# Patient Record
Sex: Female | Born: 2000 | Hispanic: No | Smoking: Never smoker
Health system: Southern US, Community
[De-identification: ages and names within clinical notes are randomized; demographics above are authoritative.]

## PROBLEM LIST (undated history)

## (undated) DIAGNOSIS — D151 Benign neoplasm of heart: Secondary | ICD-10-CM

## (undated) DIAGNOSIS — R0789 Other chest pain: Secondary | ICD-10-CM

## (undated) DIAGNOSIS — D447 Neoplasm of uncertain behavior of aortic body and other paraganglia: Secondary | ICD-10-CM

## (undated) DIAGNOSIS — F32A Depression, unspecified: Secondary | ICD-10-CM

## (undated) DIAGNOSIS — R569 Unspecified convulsions: Secondary | ICD-10-CM

## (undated) DIAGNOSIS — F329 Major depressive disorder, single episode, unspecified: Secondary | ICD-10-CM

## (undated) HISTORY — DX: Depression, unspecified: F32.A

## (undated) HISTORY — DX: Other chest pain: R07.89

## (undated) HISTORY — DX: Major depressive disorder, single episode, unspecified: F32.9

## (undated) HISTORY — PX: OTHER SURGICAL HISTORY: SHX169

---
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
## 2018-01-02 LAB — GC/CHLAMYDIA PROBE AMP (~~LOC~~) NOT AT ARMC
Chlamydia: NEGATIVE
NEISSERIA GONORRHEA: NEGATIVE

## 2018-02-18 ENCOUNTER — Other Ambulatory Visit: Payer: Self-pay

## 2018-02-18 ENCOUNTER — Encounter (HOSPITAL_COMMUNITY): Payer: Self-pay | Admitting: Emergency Medicine

## 2018-02-18 ENCOUNTER — Emergency Department (HOSPITAL_COMMUNITY): Payer: Medicaid Other

## 2018-02-18 DIAGNOSIS — Z79899 Other long term (current) drug therapy: Secondary | ICD-10-CM | POA: Insufficient documentation

## 2018-02-18 DIAGNOSIS — R0602 Shortness of breath: Secondary | ICD-10-CM | POA: Diagnosis present

## 2018-02-18 DIAGNOSIS — R55 Syncope and collapse: Secondary | ICD-10-CM | POA: Diagnosis not present
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
## 2018-02-19 ENCOUNTER — Other Ambulatory Visit: Payer: Self-pay

## 2018-02-19 ENCOUNTER — Emergency Department (HOSPITAL_COMMUNITY)
Admission: EM | Admit: 2018-02-19 | Discharge: 2018-02-19 | Disposition: A | Discharge status: Home / Self Care | Payer: Medicaid Other | Attending: Emergency Medicine | Admitting: Emergency Medicine

## 2018-02-19 DIAGNOSIS — R55 Syncope and collapse: Secondary | ICD-10-CM

## 2018-02-19 LAB — POCT I-STAT, CHEM 8
BUN: 18 mg/dL (ref 6–20)
CHLORIDE: 108 mmol/L (ref 98–111)
Calcium, Ion: 1.34 mmol/L (ref 1.15–1.40)
Creatinine, Ser: 0.8 mg/dL (ref 0.44–1.00)
GLUCOSE: 93 mg/dL (ref 70–99)
HEMATOCRIT: 35 % — AB (ref 36.0–46.0)
HEMOGLOBIN: 11.9 g/dL — AB (ref 12.0–15.0)
POTASSIUM: 4 mmol/L (ref 3.5–5.1)
Sodium: 141 mmol/L (ref 135–145)
TCO2: 23 mmol/L (ref 22–32)

## 2018-02-19 LAB — I-STAT BETA HCG BLOOD, ED (NOT ORDERABLE)

## 2018-02-19 MED ORDER — IBUPROFEN 400 MG PO TABS
400.0000 mg | ORAL_TABLET | Freq: Once | ORAL | Status: AC
  Administered 2018-02-19: 400 mg via ORAL
  Filled 2018-02-19: qty 1
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
## 2018-02-19 NOTE — ED Notes (Signed)
Checked pulse ox while ambulating pt. Pre-ambulation pulse ox was 100%. During ambulation pulse ox was 95%. Post-ambulation post ox was 98%.

## 2018-02-27 ENCOUNTER — Other Ambulatory Visit (HOSPITAL_BASED_OUTPATIENT_CLINIC_OR_DEPARTMENT_OTHER): Payer: Self-pay

## 2018-02-27 DIAGNOSIS — R55 Syncope and collapse: Secondary | ICD-10-CM

## 2018-03-03 ENCOUNTER — Ambulatory Visit: Payer: Medicaid Other | Attending: Pediatrics | Admitting: Neurology

## 2018-03-03 DIAGNOSIS — G4710 Hypersomnia, unspecified: Secondary | ICD-10-CM | POA: Diagnosis present

## 2018-03-03 DIAGNOSIS — R55 Syncope and collapse: Secondary | ICD-10-CM

## 2018-03-04 NOTE — Procedures (Signed)
  Aurora A. Merlene Laughter, MD     [email or web address]             NOCTURNAL POLYSOMNOGRAPHY   LOCATION: [facility]   Patient Name: Elizabeth Wilson, Elizabeth Wilson Date: 03/03/2018 Gender: Female D.O.B: [birth year] Age (years): 16 Referring Provider: Marcell Anger Height (inches): 62 Interpreting Physician: Phillips Odor MD, ABSM Weight (lbs): 92 RPSGT: Peak, Robert BMI: 17 MRN: [id number] Neck Size: 14.00 CLINICAL INFORMATION Sleep Study Type: NPSG     Indication for sleep study: N/A     Epworth Sleepiness Score: 17     SLEEP STUDY TECHNIQUE As per the AASM Manual for the Scoring of Sleep and Associated Events v2.3 (April 2016) with a hypopnea requiring 4% desaturations.  The channels recorded and monitored were frontal, central and occipital EEG, electrooculogram (EOG), submentalis EMG (chin), nasal and oral airflow, thoracic and abdominal wall motion, anterior tibialis EMG, snore microphone, electrocardiogram, and pulse oximetry.  MEDICATIONS Medications self-administered by patient taken the night of the study : N/A  Current Outpatient Medications:  .  escitalopram (LEXAPRO) 10 MG tablet, Take 10 mg by mouth daily., Disp: , Rfl:      SLEEP ARCHITECTURE The study was initiated at 10:26:28 PM and ended at 5:01:58 AM.  Sleep onset time was 18.0 minutes and the sleep efficiency was 88.6%%. The total sleep time was 350.5 minutes.  Stage REM latency was 229.0 minutes.  The patient spent 3.0%% of the night in stage N1 sleep, 71.9%% in stage N2 sleep, 13.1%% in stage N3 and 12% in REM.  Alpha intrusion was absent.  Supine sleep was 1.73%.  RESPIRATORY PARAMETERS The overall apnea/hypopnea index (AHI) was 0.7 per hour. There were 3 total apneas, including 0 obstructive, 3 central and 0 mixed apneas. There were 1 hypopneas and 14 RERAs.  The AHI during Stage REM sleep was 1.4 per hour.  AHI while supine was 0.0 per hour.  The mean oxygen saturation  was 97.9%. The minimum SpO2 during sleep was 95.0%.  soft snoring was noted during this study.  CARDIAC DATA The 2 lead EKG demonstrated sinus rhythm. The mean heart rate was 65.5 beats per minute. Other EKG findings include: None. LEG MOVEMENT DATA The total PLMS were 0 with a resulting PLMS index of 0.0. Associated arousal with leg movement index was 0.0.  IMPRESSIONS No significant sleep disordered breathing occurred during this study. Given that she has hypersomnia, a formal sleep consultation is suggested for additional work up.    Delano Metz, MD Diplomate, American Board of Sleep Medicine. ELECTRONICALLY SIGNED ON:  03/04/2018, 7:13 PM [place] PH: [phone]   FX: [phone] [place]

## 2018-04-04 ENCOUNTER — Encounter: Payer: Self-pay | Admitting: Cardiology
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
## 2018-04-05 ENCOUNTER — Encounter: Payer: Self-pay | Admitting: *Deleted

## 2018-04-05 ENCOUNTER — Encounter: Payer: Self-pay | Admitting: Cardiology

## 2018-04-05 ENCOUNTER — Encounter (INDEPENDENT_AMBULATORY_CARE_PROVIDER_SITE_OTHER): Payer: Medicaid Other

## 2018-04-05 ENCOUNTER — Ambulatory Visit (INDEPENDENT_AMBULATORY_CARE_PROVIDER_SITE_OTHER): Payer: Medicaid Other | Admitting: Cardiology

## 2018-04-05 ENCOUNTER — Telehealth: Payer: Self-pay | Admitting: Cardiology

## 2018-04-05 VITALS — BP 108/70 | HR 79 | Ht 62.0 in | Wt 96.0 lb

## 2018-04-05 DIAGNOSIS — Z87898 Personal history of other specified conditions: Secondary | ICD-10-CM

## 2018-04-05 DIAGNOSIS — R55 Syncope and collapse: Secondary | ICD-10-CM

## 2018-04-05 NOTE — Telephone Encounter (Signed)
°  Precert needed for: 2 D Echo (to be done at [facility]) dx: syncope   Location: [facility]     Date: Apr 10, 2018

## 2018-04-05 NOTE — Patient Instructions (Addendum)
Medication Instructions:   Your physician recommends that you continue on your current medications as directed. Please refer to the Current Medication list given to you today.  Labwork:  NONE  Testing/Procedures: Your physician has requested that you have an echocardiogram. Echocardiography is a painless test that uses sound waves to create images of your heart. It provides your doctor with information about the size and shape of your heart and how well your heart's chambers and valves are working. This procedure takes approximately one hour. There are no restrictions for this procedure. Your physician has recommended that you wear a holter monitor. Holter monitors are medical devices that record the heart's electrical activity. Doctors most often use these monitors to diagnose arrhythmias. Arrhythmias are problems with the speed or rhythm of the heartbeat. The monitor is a small, portable device. You can wear one while you do your normal daily activities. This is usually used to diagnose what is causing palpitations/syncope (passing out).  Follow-Up:  Your physician recommends that you schedule a follow-up appointment in: pending test results.  Any Other Special Instructions Will Be Listed Below (If Applicable).  If you need a refill on your cardiac medications before your next appointment, please call your pharmacy.

## 2018-04-10 ENCOUNTER — Ambulatory Visit (HOSPITAL_COMMUNITY)
Admission: RE | Admit: 2018-04-10 | Discharge: 2018-04-10 | Disposition: A | Discharge status: Home / Self Care | Payer: Medicaid Other | Source: Ambulatory Visit | Attending: Cardiology | Admitting: Cardiology

## 2018-04-10 DIAGNOSIS — R55 Syncope and collapse: Secondary | ICD-10-CM | POA: Insufficient documentation

## 2018-04-10 DIAGNOSIS — F329 Major depressive disorder, single episode, unspecified: Secondary | ICD-10-CM | POA: Diagnosis not present

## 2018-04-10 DIAGNOSIS — Z87898 Personal history of other specified conditions: Secondary | ICD-10-CM

## 2018-04-10 DIAGNOSIS — R0789 Other chest pain: Secondary | ICD-10-CM | POA: Diagnosis not present

## 2018-04-10 NOTE — Progress Notes (Signed)
*  PRELIMINARY RESULTS* Echocardiogram 2D Echocardiogram has been performed.  Elizabeth Wilson 04/10/2018, 2:53 PM

## 2018-04-14 ENCOUNTER — Telehealth: Payer: Self-pay | Admitting: *Deleted

## 2018-04-14 DIAGNOSIS — D151 Benign neoplasm of heart: Secondary | ICD-10-CM

## 2018-04-14 DIAGNOSIS — Z01810 Encounter for preprocedural cardiovascular examination: Secondary | ICD-10-CM

## 2018-04-14 NOTE — Telephone Encounter (Signed)
-----   Message from Satira Sark, MD sent at 04/12/2018  2:30 PM EDT ----- Results reviewed.  I personally reviewed the images as well and I also had them reviewed by Dr. Meda Coffee.  In terms of her history of syncope, her LVEF is normal at 55 to 60% and there are no major valvular abnormalities.  The left atrial abnormality may well be an incidental finding as it relates to current clinical symptoms, but does need further clarification.  Would be concerned that this could be an atrial myxoma.  Rather than pursuing a TEE, please talk with her about scheduling a diagnostic cardiac MRI with Dr. Meda Coffee in [place] to read. A copy of this test should be forwarded to Pennie Rushing, MD.

## 2018-04-18 NOTE — Telephone Encounter (Signed)
-----   Message from Satira Sark, MD sent at 04/12/2018  2:30 PM EDT ----- Results reviewed.  I personally reviewed the images as well and I also had them reviewed by Dr. Meda Coffee.  In terms of her history of syncope, her LVEF is normal at 55 to 60% and there are no major valvular abnormalities.  The left atrial abnormality may well be an incidental finding as it relates to current clinical symptoms, but does need further clarification.  Would be concerned that this could be an atrial myxoma.  Rather than pursuing a TEE, please talk with her about scheduling a diagnostic cardiac MRI with Dr. Meda Coffee in [place] to read. A copy of this test should be forwarded to Pennie Rushing, MD.

## 2018-04-21 ENCOUNTER — Encounter: Payer: Self-pay | Admitting: *Deleted

## 2018-04-21 NOTE — Telephone Encounter (Signed)
Letter mailed to patient requesting she contact our office.

## 2018-04-22 ENCOUNTER — Other Ambulatory Visit: Payer: Self-pay

## 2018-04-22 ENCOUNTER — Emergency Department (HOSPITAL_COMMUNITY)
Admission: EM | Admit: 2018-04-22 | Discharge: 2018-04-22 | Disposition: A | Discharge status: Home / Self Care | Payer: Medicaid Other | Attending: Emergency Medicine | Admitting: Emergency Medicine

## 2018-04-22 ENCOUNTER — Encounter (HOSPITAL_COMMUNITY): Payer: Self-pay | Admitting: Emergency Medicine

## 2018-04-22 DIAGNOSIS — R112 Nausea with vomiting, unspecified: Secondary | ICD-10-CM | POA: Insufficient documentation

## 2018-04-22 DIAGNOSIS — R197 Diarrhea, unspecified: Secondary | ICD-10-CM | POA: Insufficient documentation

## 2018-04-22 DIAGNOSIS — Z79899 Other long term (current) drug therapy: Secondary | ICD-10-CM | POA: Insufficient documentation

## 2018-04-22 MED ORDER — ONDANSETRON HCL 4 MG PO TABS: 4.0000 mg | ORAL_TABLET | Freq: Four times a day (QID) | ORAL | 0 refills | Status: DC

## 2018-04-22 MED ORDER — ONDANSETRON 4 MG PO TBDP
4.0000 mg | ORAL_TABLET | Freq: Once | ORAL | Status: AC
  Administered 2018-04-22: 4 mg via ORAL
  Filled 2018-04-22: qty 1

## 2018-04-22 NOTE — ED Triage Notes (Signed)
Pt c/o n/v/d/chills and fever x 2 days

## 2018-04-22 NOTE — ED Notes (Signed)
Pt ambulatory to waiting room. Pt verbalized understanding of discharge instructions.   

## 2018-04-22 NOTE — Discharge Instructions (Addendum)
Clear liquids.  Prescription for nausea medication.  Return if worse.

## 2018-04-22 NOTE — ED Provider Notes (Signed)
[facility] EMERGENCY DEPARTMENT Provider Note   CSN: [id number] Arrival date & time: 04/22/18  1929     History   Chief Complaint Chief Complaint  Patient presents with  . Emesis    HPI Elizabeth Wilson is a 17 y.o. female.  Nausea, vomiting, diarrhea, chills for 36 hours.  Patient is ambulatory.  She is urinating.  Normally healthy.  Severity of symptoms is mild.  Nothing makes symptoms better or worse     Past Medical History:  Diagnosis Date  . Atypical chest pain   . Depression     Patient Active Problem List   Diagnosis Date Noted  . Depression 03/08/2017  . Yeast infection 03/08/2017  . Vaginal itching 03/08/2017    Past Surgical History:  Procedure Laterality Date  . No surgical history       OB History    Gravida  0   Para  0   Term  0   Preterm  0   AB  0   Living  0     SAB  0   TAB  0   Ectopic  0   Multiple  0   Live Births  0            Home Medications    Prior to Admission medications   Medication Sig Start Date End Date Taking? Authorizing Provider  escitalopram (LEXAPRO) 10 MG tablet Take 10 mg by mouth daily.    [provider]  ondansetron (ZOFRAN) 4 MG tablet Take 1 tablet (4 mg total) by mouth every 6 (six) hours. 04/22/18   Nat Christen, MD    Family History Family History  Problem Relation Age of Onset  . Cancer Paternal Grandfather   . Lung cancer Paternal Grandmother   . Congestive Heart Failure Maternal Grandmother   . Heart attack Maternal Grandmother   . Other Maternal Grandmother        Tumor; shrinking blood vessels to brain  . Hypertension Mother   . Anxiety disorder Mother   . Asthma Brother     Social History Social History   Tobacco Use  . Smoking status: Never Smoker  . Smokeless tobacco: Never Used  Substance Use Topics  . Alcohol use: No  . Drug use: No     Allergies   Patient has no known allergies.   Review of Systems Review of Systems  All other systems  reviewed and are negative.    Physical Exam Updated Vital Signs BP 113/67 (BP Location: Right Arm)   Pulse 80   Temp 98.9 F (37.2 C) (Oral)   Resp 16   Wt 45.4 kg   LMP 11/26/2017   SpO2 100%   BMI 18.29 kg/m   Physical Exam  Constitutional: She is oriented to person, place, and time. She appears well-developed and well-nourished.  Well-hydrated, nontoxic-appearing  HENT:  Head: Normocephalic and atraumatic.  Eyes: Conjunctivae are normal.  Neck: Neck supple.  Cardiovascular: Normal rate and regular rhythm.  Pulmonary/Chest: Effort normal and breath sounds normal.  Abdominal: Soft. Bowel sounds are normal.  Musculoskeletal: Normal range of motion.  Neurological: She is alert and oriented to person, place, and time.  Skin: Skin is warm and dry.  Psychiatric: She has a normal mood and affect. Her behavior is normal.  Nursing note and vitals reviewed.    ED Treatments / Results  Labs (all labs ordered are listed, but only abnormal results are displayed) Labs Reviewed - No data to display  EKG None  Radiology No results found.  Procedures Procedures (including critical care time)  Medications Ordered in ED Medications  ondansetron (ZOFRAN-ODT) disintegrating tablet 4 mg (4 mg Oral Given 04/22/18 2016)     Initial Impression / Assessment and Plan / ED Course  I have reviewed the triage vital signs and the nursing notes.  Pertinent labs & imaging results that were available during my care of the patient were reviewed by me and considered in my medical decision making (see chart for details).     Nausea, vomiting, diarrhea for 36 hours.  Patient is nontoxic-appearing.  No clinical evidence of dehydration.  She is ambulatory.  Will Rx Zofran 4 mg as an outpatient.  Final Clinical Impressions(s) / ED Diagnoses   Final diagnoses:  Nausea vomiting and diarrhea    ED Discharge Orders         Ordered    ondansetron (ZOFRAN) 4 MG tablet  Every 6 hours      04/22/18 2024           Nat Christen, MD 04/22/18 2114

## 2018-05-03 ENCOUNTER — Other Ambulatory Visit: Payer: Self-pay | Admitting: *Deleted

## 2018-05-03 ENCOUNTER — Encounter: Payer: Self-pay | Admitting: *Deleted

## 2018-05-03 DIAGNOSIS — D151 Benign neoplasm of heart: Secondary | ICD-10-CM

## 2018-05-03 DIAGNOSIS — Z01810 Encounter for preprocedural cardiovascular examination: Secondary | ICD-10-CM

## 2018-05-03 NOTE — Telephone Encounter (Signed)
Patient's mother informed and verbalized understanding of plan. Lab order faxed to [facility] lab. Copy sent to PCP

## 2018-05-06 ENCOUNTER — Encounter (HOSPITAL_COMMUNITY): Payer: Self-pay | Admitting: Emergency Medicine

## 2018-05-06 ENCOUNTER — Other Ambulatory Visit: Payer: Self-pay

## 2018-05-06 ENCOUNTER — Emergency Department (HOSPITAL_COMMUNITY)
Admission: EM | Admit: 2018-05-06 | Discharge: 2018-05-06 | Disposition: A | Discharge status: Home / Self Care | Payer: Self-pay | Attending: Emergency Medicine | Admitting: Emergency Medicine

## 2018-05-06 ENCOUNTER — Emergency Department (HOSPITAL_COMMUNITY): Payer: Self-pay

## 2018-05-06 DIAGNOSIS — R0789 Other chest pain: Secondary | ICD-10-CM | POA: Insufficient documentation

## 2018-05-06 DIAGNOSIS — Z79899 Other long term (current) drug therapy: Secondary | ICD-10-CM | POA: Insufficient documentation

## 2018-05-06 HISTORY — DX: Benign neoplasm of heart: D15.1

## 2018-05-06 LAB — CBC WITH DIFFERENTIAL/PLATELET
Abs Immature Granulocytes: 0.02 10*3/uL (ref 0.00–0.07)
Basophils Absolute: 0 10*3/uL (ref 0.0–0.1)
Basophils Relative: 0 %
EOS ABS: 0.1 10*3/uL (ref 0.0–0.5)
EOS PCT: 1 %
HEMATOCRIT: 39.4 % (ref 36.0–46.0)
Hemoglobin: 12.6 g/dL (ref 12.0–15.0)
IMMATURE GRANULOCYTES: 0 %
LYMPHS ABS: 2 10*3/uL (ref 0.7–4.0)
Lymphocytes Relative: 29 %
MCH: 30.4 pg (ref 26.0–34.0)
MCHC: 32 g/dL (ref 30.0–36.0)
MCV: 95.2 fL (ref 80.0–100.0)
MONO ABS: 0.8 10*3/uL (ref 0.1–1.0)
MONOS PCT: 11 %
Neutro Abs: 4 10*3/uL (ref 1.7–7.7)
Neutrophils Relative %: 59 %
Platelets: 346 10*3/uL (ref 150–400)
RBC: 4.14 MIL/uL (ref 3.87–5.11)
RDW: 12.8 % (ref 11.5–15.5)
WBC: 6.8 10*3/uL (ref 4.0–10.5)
nRBC: 0 % (ref 0.0–0.2)

## 2018-05-06 LAB — COMPREHENSIVE METABOLIC PANEL
ALT: 13 U/L (ref 0–44)
AST: 19 U/L (ref 15–41)
Albumin: 4.6 g/dL (ref 3.5–5.0)
Alkaline Phosphatase: 47 U/L (ref 38–126)
Anion gap: 9 (ref 5–15)
BILIRUBIN TOTAL: 0.8 mg/dL (ref 0.3–1.2)
BUN: 12 mg/dL (ref 6–20)
CO2: 22 mmol/L (ref 22–32)
Calcium: 9.7 mg/dL (ref 8.9–10.3)
Chloride: 107 mmol/L (ref 98–111)
Creatinine, Ser: 0.75 mg/dL (ref 0.44–1.00)
GFR calc Af Amer: >60 mL/min (ref >60)
Glucose, Bld: 70 mg/dL (ref 70–99)
POTASSIUM: 3.4 mmol/L — AB (ref 3.5–5.1)
Sodium: 138 mmol/L (ref 135–145)
TOTAL PROTEIN: 7.8 g/dL (ref 6.5–8.1)

## 2018-05-06 LAB — D-DIMER, QUANTITATIVE (NOT AT ARMC): D DIMER QUANT: 0.31 ug{FEU}/mL (ref 0.00–0.50)

## 2018-05-06 MED ORDER — IBUPROFEN 800 MG PO TABS
800.0000 mg | ORAL_TABLET | Freq: Once | ORAL | Status: AC
  Administered 2018-05-06: 800 mg via ORAL
  Filled 2018-05-06: qty 1

## 2018-05-06 MED ORDER — HYDROCODONE-ACETAMINOPHEN 5-325 MG PO TABS
1.0000 | ORAL_TABLET | Freq: Once | ORAL | Status: AC
  Administered 2018-05-06: 1 via ORAL
  Filled 2018-05-06: qty 1

## 2018-05-06 MED ORDER — TRAMADOL HCL 50 MG PO TABS: ORAL_TABLET | ORAL | 0 refills | Status: DC

## 2018-05-06 NOTE — Discharge Instructions (Addendum)
Call your cardiologist office and find out when your MRI is scheduled

## 2018-05-06 NOTE — ED Notes (Signed)
Patient transported to x-ray. ?

## 2018-05-06 NOTE — ED Provider Notes (Signed)
[facility] EMERGENCY DEPARTMENT Provider Note   CSN: [id number] Arrival date & time: 05/06/18  1501     History   Chief Complaint Chief Complaint  Patient presents with  . Chest Pain    HPI Elizabeth Wilson is a 17 y.o. female.  Patient complains of right-sided chest pain.  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  Patient states the pain seems to be worse with inspiration  The history is provided by the patient. No language interpreter was used.  Chest Pain   This is a new problem. The current episode started 12 to 24 hours ago. The problem occurs constantly. The problem has not changed since onset.The pain is associated with movement and breathing. The pain is present in the lateral region. The pain is at a severity of 4/10. The pain is moderate. The quality of the pain is described as pleuritic. The pain does not radiate. Pertinent negatives include no abdominal pain, no back pain, no cough and no headaches.  Pertinent negatives for past medical history include no seizures.    Past Medical History:  Diagnosis Date  . Atrial myxoma   . Atypical chest pain   . Depression     Patient Active Problem List   Diagnosis Date Noted  . Depression 03/08/2017  . Yeast infection 03/08/2017  . Vaginal itching 03/08/2017    Past Surgical History:  Procedure Laterality Date  . No surgical history       OB History    Gravida  0   Para  0   Term  0   Preterm  0   AB  0   Living  0     SAB  0   TAB  0   Ectopic  0   Multiple  0   Live Births  0            Home Medications    Prior to Admission medications   Medication Sig Start Date End Date Taking? Authorizing Provider  medroxyPROGESTERone (DEPO-PROVERA) 150 MG/ML injection Inject 150 mg into the muscle every 3 (three) months.   Yes [provider]  ondansetron (ZOFRAN) 4 MG tablet Take 1 tablet (4 mg total) by mouth every 6 (six) hours. Patient not taking:  Reported on 05/06/2018 04/22/18   Nat Christen, MD  traMADol Veatrice Bourbon) 50 MG tablet Take 1 every 6-8 hours for pain not relieved by Tylenol or Motrin 05/06/18   Milton Ferguson, MD    Family History Family History  Problem Relation Age of Onset  . Cancer Paternal Grandfather   . Lung cancer Paternal Grandmother   . Congestive Heart Failure Maternal Grandmother   . Heart attack Maternal Grandmother   . Other Maternal Grandmother        Tumor; shrinking blood vessels to brain  . Hypertension Mother   . Anxiety disorder Mother   . Asthma Brother     Social History Social History   Tobacco Use  . Smoking status: Never Smoker  . Smokeless tobacco: Never Used  Substance Use Topics  . Alcohol use: No  . Drug use: No     Allergies   Patient has no known allergies.   Review of Systems Review of Systems  Constitutional: Negative for appetite change and fatigue.  HENT: Negative for congestion, ear discharge and sinus pressure.   Eyes: Negative for discharge.  Respiratory: Negative for cough.   Cardiovascular: Positive for chest pain.  Gastrointestinal:  Negative for abdominal pain and diarrhea.  Genitourinary: Negative for frequency and hematuria.  Musculoskeletal: Negative for back pain.  Skin: Negative for rash.  Neurological: Negative for seizures and headaches.  Psychiatric/Behavioral: Negative for hallucinations.     Physical Exam Updated Vital Signs BP 104/71   Pulse 85   Resp 19   Wt 45.4 kg   SpO2 97%   BMI 18.29 kg/m   Physical Exam  Constitutional: She is oriented to person, place, and time. She appears well-developed.  HENT:  Head: Normocephalic.  Eyes: Conjunctivae and EOM are normal. No scleral icterus.  Neck: Neck supple. No thyromegaly present.  Cardiovascular: Normal rate and regular rhythm. Exam reveals no gallop and no friction rub.  No murmur heard. Pulmonary/Chest: No stridor. She has no wheezes. She has no rales. She exhibits tenderness.    Tenderness to right chest  Abdominal: She exhibits no distension. There is no tenderness. There is no rebound.  Musculoskeletal: Normal range of motion. She exhibits no edema.  Lymphadenopathy:    She has no cervical adenopathy.  Neurological: She is oriented to person, place, and time. She exhibits normal muscle tone. Coordination normal.  Skin: No rash noted. No erythema.  Psychiatric: She has a normal mood and affect. Her behavior is normal.     ED Treatments / Results  Labs (all labs ordered are listed, but only abnormal results are displayed) Labs Reviewed  COMPREHENSIVE METABOLIC PANEL - Abnormal; Notable for the following components:      Result Value   Potassium 3.4 (*)    All other components within normal limits  CBC WITH DIFFERENTIAL/PLATELET  D-DIMER, QUANTITATIVE (NOT AT [facility])    EKG EKG Interpretation  Date/Time:  Saturday May 06 2018 15:11:08 EST Ventricular Rate:  100 PR Interval:    QRS Duration: 90 QT Interval:  339 QTC Calculation: 438 R Axis:   59 Text Interpretation:  Sinus tachycardia Right atrial enlargement Baseline wander in lead(s) V6 Confirmed by Milton Ferguson [phone]) on 05/06/2018 4:36:32 PM   Radiology Dg Chest 2 View  Result Date: 05/06/2018 CLINICAL DATA:  Chest pain for 2 days with shortness of breath and intermittent dizziness. Atrial myxoma. EXAM: CHEST - 2 VIEW COMPARISON:  Radiographs 02/18/2018 and 11/12/2017. FINDINGS: The heart size and mediastinal contours are normal. The lungs are clear. There is no pleural effusion or pneumothorax. No acute osseous findings are identified. Telemetry leads overlie the chest. IMPRESSION: Stable chest.  No active cardiopulmonary process. Electronically Signed   By: Richardean Sale M.D.   On: 05/06/2018 16:29    Procedures Procedures (including critical care time)  Medications Ordered in ED Medications  ibuprofen (ADVIL,MOTRIN) tablet 800 mg (800 mg Oral Given 05/06/18 1527)   HYDROcodone-acetaminophen (NORCO/VICODIN) 5-325 MG per tablet 1 tablet (1 tablet Oral Given 05/06/18 1649)     Initial Impression / Assessment and Plan / ED Course  I have reviewed the triage vital signs and the nursing notes.  Pertinent labs & imaging results that were available during my care of the patient were reviewed by me and considered in my medical decision making (see chart for details).     Labs EKG chest x-ray unremarkable.  Suspect pain is chest wall pain.  She is given Ultram for pain that is not relieved by Tylenol or Motrin and instructed to get in touch with her cardiologist to find out when the MRI for her atrial myxoma is.  Doubt this is causing her chest discomfort Final Clinical Impressions(s) / ED Diagnoses  Final diagnoses:  Atypical chest pain    ED Discharge Orders         Ordered    traMADol (ULTRAM) 50 MG tablet     05/06/18 1711           Milton Ferguson, MD 05/06/18 1715

## 2018-05-06 NOTE — ED Triage Notes (Signed)
Pt reports chest pain x 2 days with [facility] and intermittent dizziness. Pt dx with atrial myxoma earlier this month. Hasn't started any medications yet but is supposed to be getting a prescription.

## 2018-05-09 ENCOUNTER — Encounter: Payer: Self-pay | Admitting: *Deleted

## 2018-05-09 ENCOUNTER — Telehealth: Payer: Self-pay | Admitting: *Deleted

## 2018-05-09 NOTE — Telephone Encounter (Signed)
Left message regarding Cardiac MRI scheduled 05/26/18 at 8:00am--Will also mail information to patient

## 2018-05-09 NOTE — Telephone Encounter (Signed)
-----   Message from Baldwin Crown sent at 05/09/2018  8:48 AM EST ----- Regarding: RE: Cardiact MRI  12/13 at 8am ----- Message ----- From: Roland Earl Sent: 05/04/2018   8:14 AM EST To: Baldwin Crown, [facility] Pre Cert/Auth Subject: Cardiact MRI                                   Ordered by Dr. Domenic Polite to be ead by Dr. Meda Coffee

## 2018-05-26 ENCOUNTER — Telehealth: Payer: Self-pay | Admitting: *Deleted

## 2018-05-26 ENCOUNTER — Ambulatory Visit (HOSPITAL_COMMUNITY)
Admission: RE | Admit: 2018-05-26 | Discharge: 2018-05-26 | Disposition: A | Discharge status: Home / Self Care | Payer: Self-pay | Source: Ambulatory Visit | Attending: Cardiology | Admitting: Cardiology

## 2018-05-26 DIAGNOSIS — R9431 Abnormal electrocardiogram [ECG] [EKG]: Secondary | ICD-10-CM

## 2018-05-26 DIAGNOSIS — D151 Benign neoplasm of heart: Secondary | ICD-10-CM | POA: Insufficient documentation

## 2018-05-26 MED ORDER — GADOBUTROL 1 MMOL/ML IV SOLN
5.0000 mL | Freq: Once | INTRAVENOUS | Status: AC | PRN
  Administered 2018-05-26: 5 mL via INTRAVENOUS

## 2018-05-26 NOTE — Telephone Encounter (Signed)
Mother informed and copy sent to PCP

## 2018-05-26 NOTE — Telephone Encounter (Signed)
-----   Message from Satira Sark, MD sent at 05/26/2018  8:41 AM EST ----- Results reviewed.  Cardiac monitor did not demonstrate any significant arrhythmias, no unusual degree of bradycardia, and sinus tachycardia rates would not be out of the norm for a 17 year old. A copy of this test should be forwarded to Pennie Rushing, MD.

## 2018-05-29 ENCOUNTER — Telehealth: Payer: Self-pay | Admitting: *Deleted

## 2018-05-29 NOTE — Telephone Encounter (Signed)
Patient informed and copy sent to PCP. 

## 2018-05-29 NOTE — Telephone Encounter (Signed)
-----   Message from Satira Sark, MD sent at 05/27/2018  7:40 AM EST ----- Results reviewed.  This is very good news.  Cardiac MRI is normal.  There is no evidence of any significant intracardiac mass. A copy of this test should be forwarded to Pennie Rushing, MD.

## 2018-06-03 ENCOUNTER — Encounter (HOSPITAL_COMMUNITY): Payer: Self-pay | Admitting: Emergency Medicine

## 2018-06-03 ENCOUNTER — Other Ambulatory Visit: Payer: Self-pay

## 2018-06-03 ENCOUNTER — Emergency Department (HOSPITAL_COMMUNITY)
Admission: EM | Admit: 2018-06-03 | Discharge: 2018-06-03 | Disposition: A | Discharge status: Home / Self Care | Payer: Self-pay | Attending: Emergency Medicine | Admitting: Emergency Medicine

## 2018-06-03 DIAGNOSIS — Z79899 Other long term (current) drug therapy: Secondary | ICD-10-CM | POA: Insufficient documentation

## 2018-06-03 DIAGNOSIS — R112 Nausea with vomiting, unspecified: Secondary | ICD-10-CM | POA: Insufficient documentation

## 2018-06-03 HISTORY — DX: Unspecified convulsions: R56.9

## 2018-06-03 LAB — HCG, QUANTITATIVE, PREGNANCY

## 2018-06-03 MED ORDER — ONDANSETRON 4 MG PO TBDP: 4.0000 mg | ORAL_TABLET | Freq: Three times a day (TID) | ORAL | 0 refills | Status: DC | PRN

## 2018-06-03 MED ORDER — ONDANSETRON 4 MG PO TBDP
4.0000 mg | ORAL_TABLET | Freq: Once | ORAL | Status: AC
  Administered 2018-06-03: 4 mg via ORAL
  Filled 2018-06-03: qty 1

## 2018-06-03 NOTE — ED Notes (Signed)
Dr Lacinda Axon in to reassess

## 2018-06-03 NOTE — ED Notes (Signed)
Pt reports N/V since last night  She is in NAD  Reports she is unable to void at this time for Urine preg  Last period 6/19

## 2018-06-03 NOTE — Discharge Instructions (Addendum)
Clear liquids.  Medication for nausea.  Rest.

## 2018-06-03 NOTE — ED Provider Notes (Signed)
[facility] EMERGENCY DEPARTMENT Provider Note   CSN: [id number] Arrival date & time: 06/03/18  1221     History   Chief Complaint Chief Complaint  Patient presents with  . Abdominal Pain    HPI Elizabeth Wilson is a 17 y.o. female.  Several episodes of vomiting since last night.  No diarrhea, fever, sweats, chills.  She is drinking fluids.  Good urinary output.  Severity is mild.  Nothing makes symptoms better or worse.     Past Medical History:  Diagnosis Date  . Atrial myxoma   . Atypical chest pain   . Depression   . Seizures [facility])     Patient Active Problem List   Diagnosis Date Noted  . Depression 03/08/2017  . Yeast infection 03/08/2017  . Vaginal itching 03/08/2017    Past Surgical History:  Procedure Laterality Date  . No surgical history       OB History    Gravida  0   Para  0   Term  0   Preterm  0   AB  0   Living  0     SAB  0   TAB  0   Ectopic  0   Multiple  0   Live Births  0            Home Medications    Prior to Admission medications   Medication Sig Start Date End Date Taking? Authorizing Provider  medroxyPROGESTERone (DEPO-PROVERA) 150 MG/ML injection Inject 150 mg into the muscle every 3 (three) months.    [provider]  ondansetron (ZOFRAN ODT) 4 MG disintegrating tablet Take 1 tablet (4 mg total) by mouth every 8 (eight) hours as needed for nausea or vomiting. 06/03/18   Nat Christen, MD  ondansetron (ZOFRAN) 4 MG tablet Take 1 tablet (4 mg total) by mouth every 6 (six) hours. Patient not taking: Reported on 05/06/2018 04/22/18   Nat Christen, MD  traMADol Veatrice Bourbon) 50 MG tablet Take 1 every 6-8 hours for pain not relieved by Tylenol or Motrin 05/06/18   Milton Ferguson, MD    Family History Family History  Problem Relation Age of Onset  . Cancer Paternal Grandfather   . Lung cancer Paternal Grandmother   . Congestive Heart Failure Maternal Grandmother   . Heart attack Maternal Grandmother   .  Other Maternal Grandmother        Tumor; shrinking blood vessels to brain  . Hypertension Mother   . Anxiety disorder Mother   . Asthma Brother     Social History Social History   Tobacco Use  . Smoking status: Never Smoker  . Smokeless tobacco: Never Used  Substance Use Topics  . Alcohol use: No  . Drug use: No     Allergies   Patient has no known allergies.   Review of Systems Review of Systems  All other systems reviewed and are negative.    Physical Exam Updated Vital Signs BP 130/75 (BP Location: Right Arm)   Pulse 68   Temp 98.6 F (37 C) (Oral)   Resp 16   Ht 5\' 3"  (1.6 m)   Wt 40.8 kg   LMP 11/24/2017   SpO2 100%   BMI 15.94 kg/m   Physical Exam Vitals signs and nursing note reviewed.  Constitutional:      Appearance: She is well-developed.  HENT:     Head: Normocephalic and atraumatic.  Eyes:     Conjunctiva/sclera: Conjunctivae normal.  Neck:  Musculoskeletal: Neck supple.  Cardiovascular:     Rate and Rhythm: Normal rate and regular rhythm.  Pulmonary:     Effort: Pulmonary effort is normal.     Breath sounds: Normal breath sounds.  Abdominal:     General: Bowel sounds are normal.     Palpations: Abdomen is soft.  Musculoskeletal: Normal range of motion.  Skin:    General: Skin is warm and dry.  Neurological:     Mental Status: She is alert and oriented to person, place, and time.  Psychiatric:        Behavior: Behavior normal.      ED Treatments / Results  Labs (all labs ordered are listed, but only abnormal results are displayed) Labs Reviewed  HCG, QUANTITATIVE, PREGNANCY    EKG None  Radiology No results found.  Procedures Procedures (including critical care time)  Medications Ordered in ED Medications  ondansetron (ZOFRAN-ODT) disintegrating tablet 4 mg (4 mg Oral Given 06/03/18 1347)     Initial Impression / Assessment and Plan / ED Course  I have reviewed the triage vital signs and the nursing  notes.  Pertinent labs & imaging results that were available during my care of the patient were reviewed by me and considered in my medical decision making (see chart for details).     Patient is nontoxic-appearing.  Suspect low level gastritis.  Rx Zofran 4 mg ODT.  Final Clinical Impressions(s) / ED Diagnoses   Final diagnoses:  Intractable vomiting with nausea, unspecified vomiting type    ED Discharge Orders         Ordered    ondansetron (ZOFRAN ODT) 4 MG disintegrating tablet  Every 8 hours PRN     06/03/18 1349           Nat Christen, MD 06/03/18 1416

## 2018-06-03 NOTE — ED Triage Notes (Signed)
Pt reports she has had 3 episodes of emesis with nausea since last night, denies d/fever

## 2018-06-03 NOTE — ED Notes (Signed)
Dr C in to assess 

## 2018-06-03 NOTE — ED Notes (Signed)
Pt has had no vomiting since arrival to treatment area

## 2018-06-10 ENCOUNTER — Emergency Department (HOSPITAL_COMMUNITY)
Admission: EM | Admit: 2018-06-10 | Discharge: 2018-06-10 | Disposition: A | Discharge status: Home / Self Care | Payer: Self-pay | Attending: Emergency Medicine | Admitting: Emergency Medicine

## 2018-06-10 ENCOUNTER — Emergency Department (HOSPITAL_COMMUNITY): Payer: Self-pay

## 2018-06-10 ENCOUNTER — Other Ambulatory Visit: Payer: Self-pay

## 2018-06-10 ENCOUNTER — Encounter (HOSPITAL_COMMUNITY): Payer: Self-pay | Admitting: Emergency Medicine

## 2018-06-10 DIAGNOSIS — K2970 Gastritis, unspecified, without bleeding: Secondary | ICD-10-CM

## 2018-06-10 DIAGNOSIS — A084 Viral intestinal infection, unspecified: Secondary | ICD-10-CM | POA: Insufficient documentation

## 2018-06-10 DIAGNOSIS — E876 Hypokalemia: Secondary | ICD-10-CM | POA: Insufficient documentation

## 2018-06-10 DIAGNOSIS — Z79899 Other long term (current) drug therapy: Secondary | ICD-10-CM | POA: Insufficient documentation

## 2018-06-10 DIAGNOSIS — J111 Influenza due to unidentified influenza virus with other respiratory manifestations: Secondary | ICD-10-CM | POA: Insufficient documentation

## 2018-06-10 DIAGNOSIS — R6889 Other general symptoms and signs: Secondary | ICD-10-CM

## 2018-06-10 HISTORY — DX: Neoplasm of uncertain behavior of aortic body and other paraganglia: D44.7

## 2018-06-10 LAB — CBC WITH DIFFERENTIAL/PLATELET
Abs Immature Granulocytes: 0 10*3/uL (ref 0.00–0.07)
BASOS ABS: 0 10*3/uL (ref 0.0–0.1)
BASOS PCT: 0 %
EOS PCT: 0 %
Eosinophils Absolute: 0 10*3/uL (ref 0.0–0.5)
HCT: 34.9 % — ABNORMAL LOW (ref 36.0–46.0)
HEMOGLOBIN: 11.4 g/dL — AB (ref 12.0–15.0)
Immature Granulocytes: 0 %
LYMPHS PCT: 26 %
Lymphs Abs: 1.3 10*3/uL (ref 0.7–4.0)
MCH: 30.4 pg (ref 26.0–34.0)
MCHC: 32.7 g/dL (ref 30.0–36.0)
MCV: 93.1 fL (ref 80.0–100.0)
Monocytes Absolute: 0.6 10*3/uL (ref 0.1–1.0)
Monocytes Relative: 12 %
NRBC: 0 % (ref 0.0–0.2)
Neutro Abs: 3 10*3/uL (ref 1.7–7.7)
Neutrophils Relative %: 62 %
PLATELETS: 221 10*3/uL (ref 150–400)
RBC: 3.75 MIL/uL — AB (ref 3.87–5.11)
RDW: 12.2 % (ref 11.5–15.5)
WBC: 4.9 10*3/uL (ref 4.0–10.5)

## 2018-06-10 LAB — COMPREHENSIVE METABOLIC PANEL
ALK PHOS: 45 U/L (ref 38–126)
ALT: 12 U/L (ref 0–44)
ANION GAP: 6 (ref 5–15)
AST: 17 U/L (ref 15–41)
Albumin: 3.9 g/dL (ref 3.5–5.0)
BUN: 9 mg/dL (ref 6–20)
CALCIUM: 8.9 mg/dL (ref 8.9–10.3)
CO2: 22 mmol/L (ref 22–32)
Chloride: 107 mmol/L (ref 98–111)
Creatinine, Ser: 0.71 mg/dL (ref 0.44–1.00)
GFR calc non Af Amer: >60 mL/min (ref >60)
Glucose, Bld: 98 mg/dL (ref 70–99)
Potassium: 3.2 mmol/L — ABNORMAL LOW (ref 3.5–5.1)
SODIUM: 135 mmol/L (ref 135–145)
Total Bilirubin: 0.3 mg/dL (ref 0.3–1.2)
Total Protein: 7 g/dL (ref 6.5–8.1)

## 2018-06-10 LAB — GROUP A STREP BY PCR: GROUP A STREP BY PCR: NOT DETECTED

## 2018-06-10 LAB — LIPASE, BLOOD: Lipase: 32 U/L (ref 11–51)

## 2018-06-10 LAB — POC URINE PREG, ED: Preg Test, Ur: NEGATIVE

## 2018-06-10 MED ORDER — POTASSIUM CHLORIDE CRYS ER 20 MEQ PO TBCR
20.0000 meq | EXTENDED_RELEASE_TABLET | Freq: Once | ORAL | Status: AC
  Administered 2018-06-10: 20 meq via ORAL
  Filled 2018-06-10: qty 1

## 2018-06-10 MED ORDER — IBUPROFEN 400 MG PO TABS
400.0000 mg | ORAL_TABLET | Freq: Once | ORAL | Status: AC
  Administered 2018-06-10: 400 mg via ORAL
  Filled 2018-06-10: qty 1

## 2018-06-10 MED ORDER — POTASSIUM CHLORIDE CRYS ER 20 MEQ PO TBCR: 20.0000 meq | EXTENDED_RELEASE_TABLET | Freq: Two times a day (BID) | ORAL | 0 refills | Status: DC

## 2018-06-10 MED ORDER — BENZONATATE 100 MG PO CAPS
200.0000 mg | ORAL_CAPSULE | Freq: Once | ORAL | Status: AC
  Administered 2018-06-10: 200 mg via ORAL
  Filled 2018-06-10: qty 2

## 2018-06-10 MED ORDER — BENZONATATE 100 MG PO CAPS: 200.0000 mg | ORAL_CAPSULE | Freq: Three times a day (TID) | ORAL | 0 refills | Status: DC | PRN

## 2018-06-10 MED ORDER — ONDANSETRON 4 MG PO TBDP: 4.0000 mg | ORAL_TABLET | Freq: Three times a day (TID) | ORAL | 0 refills | Status: DC | PRN

## 2018-06-10 NOTE — ED Triage Notes (Signed)
Patient c/o productive cough with thick yellow sputum, body aches, nasal drainage, fevers, sore throat, and vomiting. Denies any diarrhea. Per patient cough started last week and vomiting started 2 days ago. Recently exposed to someone with strep.

## 2018-06-10 NOTE — Discharge Instructions (Addendum)
Rest and make sure you are drinking plenty of fluids.  Your lab tests today are reassuring, I suspect you have the same virus that is going around your nursing home facility.  Use the Zofran if needed for continued nausea or vomiting.  Your potassium is a little low this evening which can be a result of your vomiting.  You have been prescribed supplementation to help replace this for the next 5 days.  Get rechecked for any worsening or persistent symptoms.

## 2018-06-10 NOTE — ED Notes (Signed)
Patient transported to X-ray 

## 2018-06-13 NOTE — ED Provider Notes (Signed)
[facility] EMERGENCY DEPARTMENT Provider Note   CSN: [id number] Arrival date & time: 06/10/18  1826     History   Chief Complaint Chief Complaint  Patient presents with  . Emesis    HPI Elizabeth Wilson is a 17 y.o. female with a history of seizures and atypical chest pain, recent questionable atrial myxoma (ruled by cardiac mr on 12/13 per chart) presenting with flu like symptoms and exposure to strep throat.  She was seen here on 12/21 for n/v which was resolved but now developed a cough productive of yellow sputum production, nasal drainage, subjective fevers, sore throat along with post tussive emesis, last occurring early this am. She also describes generalized fatigue.  She has found no alleviators for her sx.  She denies abdominal pain, dysuria, diarrhea, also denies chest pain or sob, no neck pain, stiffness, headache.   The history is provided by the patient.    Past Medical History:  Diagnosis Date  . Atrial myxoma   . Atypical chest pain   . Depression   . 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 Seizures [facility])     Patient Active Problem List   Diagnosis Date Noted  . Depression 03/08/2017  . Yeast infection 03/08/2017  . Vaginal itching 03/08/2017    Past Surgical History:  Procedure Laterality Date  . No surgical history       OB History    Gravida  0   Para  0   Term  0   Preterm  0   AB  0   Living  0     SAB  0   TAB  0   Ectopic  0   Multiple  0   Live Births  0            Home Medications    Prior to Admission medications   Medication Sig Start Date End Date Taking? Authorizing Provider  benzonatate (TESSALON) 100 MG capsule Take 2 capsules (200 mg total) by mouth 3 (three) times daily as needed. 06/10/18   Evalee Jefferson, PA-C  medroxyPROGESTERone (DEPO-PROVERA) 150 MG/ML injection Inject 150 mg into the muscle every 3 (three) months.    [provider]  ondansetron (ZOFRAN ODT) 4 MG disintegrating tablet Take 1 tablet (4 mg  total) by mouth every 8 (eight) hours as needed for nausea or vomiting. 06/10/18   Dent Plantz, Almyra Free, PA-C  ondansetron (ZOFRAN) 4 MG tablet Take 1 tablet (4 mg total) by mouth every 6 (six) hours. Patient not taking: Reported on 05/06/2018 04/22/18   Nat Christen, MD  potassium chloride SA (K-DUR,KLOR-CON) 20 MEQ tablet Take 1 tablet (20 mEq total) by mouth 2 (two) times daily. 06/10/18   Evalee Jefferson, PA-C  traMADol (ULTRAM) 50 MG tablet Take 1 every 6-8 hours for pain not relieved by Tylenol or Motrin 05/06/18   Milton Ferguson, MD    Family History Family History  Problem Relation Age of Onset  . Cancer Paternal Grandfather   . Lung cancer Paternal Grandmother   . Congestive Heart Failure Maternal Grandmother   . Heart attack Maternal Grandmother   . Other Maternal Grandmother        Tumor; shrinking blood vessels to brain  . Hypertension Mother   . Anxiety disorder Mother   . Asthma Brother     Social History Social History   Tobacco Use  . Smoking status: Never Smoker  . Smokeless tobacco: Never Used  Substance Use Topics  . Alcohol use:  No  . Drug use: No     Allergies   Patient has no known allergies.   Review of Systems Review of Systems  Constitutional: Positive for fever.  HENT: Positive for postnasal drip, rhinorrhea and sore throat. Negative for congestion and ear pain.   Eyes: Negative.   Respiratory: Positive for cough. Negative for chest tightness, shortness of breath and wheezing.   Cardiovascular: Negative for chest pain.  Gastrointestinal: Positive for vomiting. Negative for abdominal pain, diarrhea and nausea.  Genitourinary: Negative.   Musculoskeletal: Positive for myalgias. Negative for arthralgias, joint swelling and neck pain.  Skin: Negative.  Negative for rash and wound.  Neurological: Negative for dizziness, weakness, light-headedness, numbness and headaches.  Psychiatric/Behavioral: Negative.      Physical Exam Updated Vital Signs BP 101/66    Pulse 97   Temp 98.9 F (37.2 C) (Oral)   Resp 14   Ht 5\' 3"  (1.6 m)   Wt 40.8 kg   LMP 11/26/2017   SpO2 100%   BMI 15.94 kg/m   Physical Exam Vitals signs and nursing note reviewed.  Constitutional:      Appearance: She is well-developed.  HENT:     Head: Normocephalic and atraumatic.     Nose: Rhinorrhea present. No congestion.     Mouth/Throat:     Mouth: Mucous membranes are moist.     Pharynx: Posterior oropharyngeal erythema present. No oropharyngeal exudate.     Comments: Mild posterior pharyngeal erythema. No exudate, no petechia or edema. Eyes:     Conjunctiva/sclera: Conjunctivae normal.  Neck:     Musculoskeletal: Normal range of motion. No neck rigidity.  Cardiovascular:     Rate and Rhythm: Normal rate and regular rhythm.     Heart sounds: Normal heart sounds.  Pulmonary:     Effort: Pulmonary effort is normal. No respiratory distress.     Breath sounds: Normal breath sounds. No wheezing or rhonchi.  Abdominal:     General: Bowel sounds are normal. There is no distension.     Palpations: Abdomen is soft.     Tenderness: There is no abdominal tenderness. There is no guarding.  Musculoskeletal: Normal range of motion.  Lymphadenopathy:     Cervical: No cervical adenopathy.  Skin:    General: Skin is warm and dry.  Neurological:     Mental Status: She is alert.      ED Treatments / Results  Labs (all labs ordered are listed, but only abnormal results are displayed) Labs Reviewed  COMPREHENSIVE METABOLIC PANEL - Abnormal; Notable for the following components:      Result Value   Potassium 3.2 (*)    All other components within normal limits  CBC WITH DIFFERENTIAL/PLATELET - Abnormal; Notable for the following components:   RBC 3.75 (*)    Hemoglobin 11.4 (*)    HCT 34.9 (*)    All other components within normal limits  GROUP A STREP BY PCR  LIPASE, BLOOD  POC URINE PREG, ED    EKG None  Radiology No results  found.  Procedures Procedures (including critical care time)  Medications Ordered in ED Medications  benzonatate (TESSALON) capsule 200 mg (200 mg Oral Given 06/10/18 2137)  ibuprofen (ADVIL,MOTRIN) tablet 400 mg (400 mg Oral Given 06/10/18 2137)  potassium chloride SA (K-DUR,KLOR-CON) CR tablet 20 mEq (20 mEq Oral Given 06/10/18 2308)     Initial Impression / Assessment and Plan / ED Course  I have reviewed the triage vital signs and the nursing  notes.  Pertinent labs & imaging results that were available during my care of the patient were reviewed by me and considered in my medical decision making (see chart for details).     Labs reviewed and discussed, suspect viral syndrome, possible influenza. Labs stable except for mild hypokalemia which was addressed. She was encouraged rest, increased fluids, prescribed zofran and tessalon for cough. Strep negative.  Final Clinical Impressions(s) / ED Diagnoses   Final diagnoses:  Viral gastritis  Hypokalemia  Flu-like symptoms    ED Discharge Orders         Ordered    potassium chloride SA (K-DUR,KLOR-CON) 20 MEQ tablet  2 times daily     06/10/18 2250    ondansetron (ZOFRAN ODT) 4 MG disintegrating tablet  Every 8 hours PRN     06/10/18 2250    benzonatate (TESSALON) 100 MG capsule  3 times daily PRN     06/10/18 2253           Evalee Jefferson, PA-C 06/13/18 1309    Carmin Muskrat, MD 06/14/18 2222

## 2018-07-28 ENCOUNTER — Emergency Department (HOSPITAL_COMMUNITY)
Admission: EM | Admit: 2018-07-28 | Discharge: 2018-07-29 | Disposition: A | Discharge status: Home / Self Care | Payer: Self-pay | Attending: Emergency Medicine | Admitting: Emergency Medicine

## 2018-07-28 ENCOUNTER — Encounter (HOSPITAL_COMMUNITY): Payer: Self-pay | Admitting: Emergency Medicine

## 2018-07-28 ENCOUNTER — Other Ambulatory Visit: Payer: Self-pay

## 2018-07-28 DIAGNOSIS — Y9389 Activity, other specified: Secondary | ICD-10-CM | POA: Insufficient documentation

## 2018-07-28 DIAGNOSIS — Y929 Unspecified place or not applicable: Secondary | ICD-10-CM | POA: Insufficient documentation

## 2018-07-28 DIAGNOSIS — S39012A Strain of muscle, fascia and tendon of lower back, initial encounter: Secondary | ICD-10-CM | POA: Insufficient documentation

## 2018-07-28 DIAGNOSIS — Z79899 Other long term (current) drug therapy: Secondary | ICD-10-CM | POA: Insufficient documentation

## 2018-07-28 DIAGNOSIS — X500XXA Overexertion from strenuous movement or load, initial encounter: Secondary | ICD-10-CM | POA: Insufficient documentation

## 2018-07-28 DIAGNOSIS — Y990 Civilian activity done for income or pay: Secondary | ICD-10-CM | POA: Insufficient documentation

## 2018-07-28 MED ORDER — NAPROXEN 500 MG PO TABS: 500.0000 mg | ORAL_TABLET | Freq: Two times a day (BID) | ORAL | 0 refills | Status: DC | PRN

## 2018-07-28 MED ORDER — NAPROXEN 250 MG PO TABS
500.0000 mg | ORAL_TABLET | Freq: Once | ORAL | Status: AC
  Administered 2018-07-28: 500 mg via ORAL
  Filled 2018-07-28: qty 2

## 2018-07-28 MED ORDER — METHOCARBAMOL 500 MG PO TABS: 500.0000 mg | ORAL_TABLET | Freq: Three times a day (TID) | ORAL | 0 refills | Status: DC | PRN

## 2018-07-28 MED ORDER — METHOCARBAMOL 500 MG PO TABS
500.0000 mg | ORAL_TABLET | Freq: Once | ORAL | Status: AC
  Administered 2018-07-28: 500 mg via ORAL
  Filled 2018-07-28: qty 1

## 2018-07-28 NOTE — ED Provider Notes (Signed)
[facility] EMERGENCY DEPARTMENT Provider Note   CSN: [id number] Arrival date & time: 07/28/18  2317     History   Chief Complaint Chief Complaint  Patient presents with  . Back Pain    HPI Elizabeth Wilson is a 18 y.o. female.  Patient presents with low back pain onset yesterday.  States she works for a [employer] home and was taking out trash bags full of soda cans when the pain started.  Denies any back pain prior to this.  Pain is across her low back diffusely.  She been taking Tylenol at home without relief.  Denies any radiation of the pain down her buttocks or down her legs.  No weakness in her legs, numbness or tingling.  No bowel or bladder incontinence.  No fevers, chills, nausea or vomiting.  She denies any history of IV drug abuse or cancer.  She is able to ambulate.  She came in tonight because the pain was worse after she was sitting down today.  She denies any history of back problems or back surgery.  No urinary or vaginal symptoms.  Denies any possibility of pregnancy.   Back Pain  Associated symptoms: no chest pain, no dysuria, no fever, no headaches and no weakness     Past Medical History:  Diagnosis Date  . Atrial myxoma   . Atypical chest pain   . Depression   . 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 Seizures [facility])     Patient Active Problem List   Diagnosis Date Noted  . Depression 03/08/2017  . Yeast infection 03/08/2017  . Vaginal itching 03/08/2017    Past Surgical History:  Procedure Laterality Date  . No surgical history       OB History    Gravida  0   Para  0   Term  0   Preterm  0   AB  0   Living  0     SAB  0   TAB  0   Ectopic  0   Multiple  0   Live Births  0            Home Medications    Prior to Admission medications   Medication Sig Start Date End Date Taking? Authorizing Provider  benzonatate (TESSALON) 100 MG capsule Take 2 capsules (200 mg total) by mouth 3 (three) times daily as needed. 06/10/18    Evalee Jefferson, PA-C  medroxyPROGESTERone (DEPO-PROVERA) 150 MG/ML injection Inject 150 mg into the muscle every 3 (three) months.    [provider]  ondansetron (ZOFRAN ODT) 4 MG disintegrating tablet Take 1 tablet (4 mg total) by mouth every 8 (eight) hours as needed for nausea or vomiting. 06/10/18   Idol, Almyra Free, PA-C  ondansetron (ZOFRAN) 4 MG tablet Take 1 tablet (4 mg total) by mouth every 6 (six) hours. Patient not taking: Reported on 05/06/2018 04/22/18   Nat Christen, MD  potassium chloride SA (K-DUR,KLOR-CON) 20 MEQ tablet Take 1 tablet (20 mEq total) by mouth 2 (two) times daily. 06/10/18   Evalee Jefferson, PA-C  traMADol (ULTRAM) 50 MG tablet Take 1 every 6-8 hours for pain not relieved by Tylenol or Motrin 05/06/18   Milton Ferguson, MD    Family History Family History  Problem Relation Age of Onset  . Cancer Paternal Grandfather   . Lung cancer Paternal Grandmother   . Congestive Heart Failure Maternal Grandmother   . Heart attack Maternal Grandmother   . Other Maternal Grandmother  Tumor; shrinking blood vessels to brain  . Hypertension Mother   . Anxiety disorder Mother   . Asthma Brother     Social History Social History   Tobacco Use  . Smoking status: Never Smoker  . Smokeless tobacco: Never Used  Substance Use Topics  . Alcohol use: No  . Drug use: No     Allergies   Patient has no allergy information on record.   Review of Systems Review of Systems  Constitutional: Negative for activity change, appetite change and fever.  HENT: Negative for congestion and rhinorrhea.   Respiratory: Negative for cough, chest tightness and shortness of breath.   Cardiovascular: Negative for chest pain.  Gastrointestinal: Negative for abdominal distention, nausea and vomiting.  Genitourinary: Negative for dysuria, hematuria, vaginal bleeding and vaginal discharge.  Musculoskeletal: Positive for back pain.  Skin: Negative for rash.  Neurological: Negative for  dizziness, weakness and headaches.   all other systems are negative except as noted in the HPI and PMH.     Physical Exam Updated Vital Signs BP 115/73 (BP Location: Right Arm)   Pulse 68   Temp 98 F (36.7 C) (Oral)   Resp 18   Ht 5\' 3"  (1.6 m)   Wt 46.7 kg   LMP 06/28/2018 (Exact Date)   SpO2 100%   BMI 18.25 kg/m   Physical Exam Vitals signs and nursing note reviewed.  Constitutional:      General: She is not in acute distress.    Appearance: She is well-developed.  HENT:     Head: Normocephalic and atraumatic.     Mouth/Throat:     Pharynx: No oropharyngeal exudate.  Eyes:     Conjunctiva/sclera: Conjunctivae normal.     Pupils: Pupils are equal, round, and reactive to light.  Neck:     Musculoskeletal: Normal range of motion and neck supple.     Comments: No meningismus. Cardiovascular:     Rate and Rhythm: Normal rate and regular rhythm.     Heart sounds: Normal heart sounds. No murmur.  Pulmonary:     Effort: Pulmonary effort is normal. No respiratory distress.     Breath sounds: Normal breath sounds.  Abdominal:     Palpations: Abdomen is soft.     Tenderness: There is no abdominal tenderness. There is no guarding or rebound.  Musculoskeletal: Normal range of motion.        General: No tenderness.     Comments: Paraspinal lumbar tenderness bilaterally, no midline tenderness  5/5 strength in bilateral lower extremities. Ankle plantar and dorsiflexion intact. Great toe extension intact bilaterally. +2 DP and PT pulses. +2 patellar reflexes bilaterally. Normal gait.   Skin:    General: Skin is warm.  Neurological:     General: No focal deficit present.     Mental Status: She is alert and oriented to person, place, and time. Mental status is at baseline.     Cranial Nerves: No cranial nerve deficit.     Motor: No abnormal muscle tone.     Coordination: Coordination normal.     Comments:  5/5 strength throughout. CN 2-12 intact.Equal grip strength.     Psychiatric:        Behavior: Behavior normal.      ED Treatments / Results  Labs (all labs ordered are listed, but only abnormal results are displayed) Labs Reviewed - No data to display  EKG None  Radiology No results found.  Procedures Procedures (including critical care time)  Medications Ordered  in ED Medications  methocarbamol (ROBAXIN) tablet 500 mg (has no administration in time range)  naproxen (NAPROSYN) tablet 500 mg (has no administration in time range)     Initial Impression / Assessment and Plan / ED Course  I have reviewed the triage vital signs and the nursing notes.  Pertinent labs & imaging results that were available during my care of the patient were reviewed by me and considered in my medical decision making (see chart for details).    Low back pain after lifting heavy objects.  Neurovascularly intact with intact pulses, strength, reflexes and sensation.  Low suspicion for cord compression or cauda equina.  Patient will be treated with anti-inflammatories and muscle relaxers.  Discussed heat, back exercises, anti-inflammatories and PCP follow-up. Return precautions discussed.  Final Clinical Impressions(s) / ED Diagnoses   Final diagnoses:  Strain of lumbar region, initial encounter    ED Discharge Orders    None       River Mckercher, Annie Main, MD 07/29/18 [phone]

## 2018-07-28 NOTE — Discharge Instructions (Addendum)
There is no evidence of spinal cord problem.  Take the anti-inflammatories and muscle relaxers as prescribed.  Do not lift more than 10 pounds until cleared by your doctor.  Return to the ED if you develop worsening pain, weakness, numbness, bowel or bladder incontinence or any other concerns.

## 2018-07-28 NOTE — ED Triage Notes (Signed)
Patient was taking out trash and feels like she pull a muscle in her lower back .

## 2018-08-27 ENCOUNTER — Encounter (HOSPITAL_COMMUNITY): Payer: Self-pay | Admitting: Emergency Medicine

## 2018-08-27 ENCOUNTER — Other Ambulatory Visit: Payer: Self-pay

## 2018-08-27 ENCOUNTER — Emergency Department (HOSPITAL_COMMUNITY)
Admission: EM | Admit: 2018-08-27 | Discharge: 2018-08-27 | Disposition: A | Discharge status: Home / Self Care | Payer: Self-pay | Attending: Emergency Medicine | Admitting: Emergency Medicine

## 2018-08-27 DIAGNOSIS — J111 Influenza due to unidentified influenza virus with other respiratory manifestations: Secondary | ICD-10-CM

## 2018-08-27 DIAGNOSIS — Z79899 Other long term (current) drug therapy: Secondary | ICD-10-CM | POA: Insufficient documentation

## 2018-08-27 DIAGNOSIS — R69 Illness, unspecified: Secondary | ICD-10-CM

## 2018-08-27 MED ORDER — OSELTAMIVIR PHOSPHATE 75 MG PO CAPS: 75.0000 mg | ORAL_CAPSULE | Freq: Two times a day (BID) | ORAL | 0 refills | Status: DC

## 2018-08-27 MED ORDER — IBUPROFEN 400 MG PO TABS: 400.0000 mg | ORAL_TABLET | Freq: Four times a day (QID) | ORAL | 0 refills | Status: DC | PRN

## 2018-08-27 NOTE — ED Notes (Signed)
Patient states she has had a cough since Friday. 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

## 2018-08-27 NOTE — ED Provider Notes (Signed)
[place] Provider Note   CSN: [id number] Arrival date & time: 08/27/18  1325    History   Chief Complaint Chief Complaint  Patient presents with  . Cough    HPI Elizabeth Wilson is a 18 y.o. female.     HPI   Elizabeth A Preston is a 18 y.o. female who presents to the Emergency Department complaining of nasal congestion, rhinorrhea, cough and intermittent nausea.  Symptoms began 2 days ago.  Initially, she reports fever of 102 at home on the day her symptoms began, she has been taking Tylenol and denies fever today.  Last dose of Tylenol was yesterday.  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  She denies chest or abdominal pain, shortness of breath, dysuria, vomiting or diarrhea.   Past Medical History:  Diagnosis Date  . Atrial myxoma   . Atypical chest pain   . Depression   . 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 Seizures [facility])     Patient Active Problem List   Diagnosis Date Noted  . Depression 03/08/2017  . Yeast infection 03/08/2017  . Vaginal itching 03/08/2017    Past Surgical History:  Procedure Laterality Date  . No surgical history       OB History    Gravida  0   Para  0   Term  0   Preterm  0   AB  0   Living  0     SAB  0   TAB  0   Ectopic  0   Multiple  0   Live Births  0            Home Medications    Prior to Admission medications   Medication Sig Start Date End Date Taking? Authorizing Provider  benzonatate (TESSALON) 100 MG capsule Take 2 capsules (200 mg total) by mouth 3 (three) times daily as needed. 06/10/18   Evalee Jefferson, PA-C  ibuprofen (ADVIL,MOTRIN) 400 MG tablet Take 1 tablet (400 mg total) by mouth every 6 (six) hours as needed. 08/27/18   Rhythm Gubbels, PA-C  medroxyPROGESTERone (DEPO-PROVERA) 150 MG/ML injection Inject 150 mg into the muscle every 3 (three) months.    [provider]  methocarbamol (ROBAXIN) 500 MG tablet Take 1 tablet  (500 mg total) by mouth every 8 (eight) hours as needed for muscle spasms. 07/28/18   Rancour, Annie Main, MD  naproxen (NAPROSYN) 500 MG tablet Take 1 tablet (500 mg total) by mouth 2 (two) times daily as needed. 07/28/18   Rancour, Annie Main, MD  ondansetron (ZOFRAN ODT) 4 MG disintegrating tablet Take 1 tablet (4 mg total) by mouth every 8 (eight) hours as needed for nausea or vomiting. 06/10/18   Idol, Almyra Free, PA-C  ondansetron (ZOFRAN) 4 MG tablet Take 1 tablet (4 mg total) by mouth every 6 (six) hours. Patient not taking: Reported on 05/06/2018 04/22/18   Nat Christen, MD  oseltamivir (TAMIFLU) 75 MG capsule Take 1 capsule (75 mg total) by mouth 2 (two) times daily. 08/27/18   Ilia Dimaano, PA-C  potassium chloride SA (K-DUR,KLOR-CON) 20 MEQ tablet Take 1 tablet (20 mEq total) by mouth 2 (two) times daily. 06/10/18   Evalee Jefferson, PA-C  traMADol (ULTRAM) 50 MG tablet Take 1 every 6-8 hours for pain not relieved by Tylenol or Motrin 05/06/18   Milton Ferguson, MD    Family History Family History  Problem Relation Age of Onset  .  Cancer Paternal Grandfather   . Lung cancer Paternal Grandmother   . Congestive Heart Failure Maternal Grandmother   . Heart attack Maternal Grandmother   . Other Maternal Grandmother        Tumor; shrinking blood vessels to brain  . Hypertension Mother   . Anxiety disorder Mother   . Asthma Brother     Social History Social History   Tobacco Use  . Smoking status: Never Smoker  . Smokeless tobacco: Never Used  Substance Use Topics  . Alcohol use: No  . Drug use: No     Allergies   Patient has no known allergies.   Review of Systems Review of Systems  Constitutional: Positive for fever. Negative for activity change, appetite change and chills.  HENT: Positive for congestion and sore throat. Negative for facial swelling, rhinorrhea and trouble swallowing.   Eyes: Negative for visual disturbance.  Respiratory: Positive for cough. Negative for shortness  of breath, wheezing and stridor.   Cardiovascular: Negative for chest pain.  Gastrointestinal: Negative for abdominal pain, diarrhea, nausea and vomiting.  Genitourinary: Negative for decreased urine volume, difficulty urinating and dysuria.  Musculoskeletal: Positive for myalgias. Negative for neck pain and neck stiffness.  Skin: Negative for rash.  Neurological: Negative for dizziness, weakness, numbness and headaches.  Hematological: Negative for adenopathy.  Psychiatric/Behavioral: Negative for confusion.     Physical Exam Updated Vital Signs BP 104/66   Pulse 72   Temp 98.1 F (36.7 C) (Oral)   Resp 16   Ht 5\' 3"  (1.6 m)   Wt 47.6 kg   SpO2 100%   BMI 18.60 kg/m   Physical Exam Vitals signs and nursing note reviewed.  Constitutional:      General: She is not in acute distress.    Appearance: Normal appearance. She is not ill-appearing.  HENT:     Head: Atraumatic.     Right Ear: Tympanic membrane and ear canal normal.     Left Ear: Tympanic membrane and ear canal normal.     Nose: Nose normal.     Mouth/Throat:     Mouth: Mucous membranes are moist.     Pharynx: Oropharynx is clear. Posterior oropharyngeal erythema present. No pharyngeal swelling, oropharyngeal exudate or uvula swelling.     Tonsils: No tonsillar exudate or tonsillar abscesses.     Comments: Mild erythema of the oropharynx.  No edema or exudates.  Uvula is midline and non-edematous.   Neck:     Musculoskeletal: Normal range of motion.  Cardiovascular:     Rate and Rhythm: Normal rate and regular rhythm.     Pulses: Normal pulses.  Pulmonary:     Effort: Pulmonary effort is normal.     Breath sounds: No stridor. No wheezing or rhonchi.  Abdominal:     General: There is no distension.     Palpations: Abdomen is soft.     Tenderness: There is no abdominal tenderness.  Musculoskeletal: Normal range of motion.  Skin:    General: Skin is warm.     Capillary Refill: Capillary refill takes less  than 2 seconds.     Findings: No rash.  Neurological:     General: No focal deficit present.     Mental Status: She is alert.      ED Treatments / Results  Labs (all labs ordered are listed, but only abnormal results are displayed) Labs Reviewed - No data to display  EKG None  Radiology No results found.  Procedures Procedures (including  critical care time)  Medications Ordered in ED Medications - No data to display   Initial Impression / Assessment and Plan / ED Course  I have reviewed the triage vital signs and the nursing notes.  Pertinent labs & imaging results that were available during my care of the patient were reviewed by me and considered in my medical decision making (see chart for details).        Pt well appearing.  Sx's c/w flu like illness.  Since she has exposures to influenza, I will prescribe tamiflu.  Doubt strep pharyngitis.  She appears appropriate for d/c home and close out pt f/u if needed  Final Clinical Impressions(s) / ED Diagnoses   Final diagnoses:  Influenza-like illness    ED Discharge Orders         Ordered    oseltamivir (TAMIFLU) 75 MG capsule  2 times daily     08/27/18 1458    ibuprofen (ADVIL,MOTRIN) 400 MG tablet  Every 6 hours PRN     08/27/18 [place], Mozetta Murfin, PA-C 08/27/18 1603    Nat Christen, MD 08/28/18 1805

## 2018-08-27 NOTE — Discharge Instructions (Addendum)
Drink plenty of fluids, take Tylenol every 4 hours for fever and/or body aches.  Start the Tamiflu today.  Follow-up with your primary doctor for recheck return to ER for any worsening symptoms.  You may take over-the-counter Mucinex if needed for cough.

## 2018-08-27 NOTE — ED Triage Notes (Signed)
Patient c/o productive cough that started Friday. Per patient thick yellow sputum. Patient states fever yesterday but not today. Patient taking tylenol with some relief- last dose yesterday. Per patient some nausea. Denies any vomiting or diarrhea.

## 2018-08-31 ENCOUNTER — Emergency Department (HOSPITAL_COMMUNITY)
Admission: EM | Admit: 2018-08-31 | Discharge: 2018-09-01 | Disposition: A | Discharge status: Home / Self Care | Payer: Self-pay | Attending: Emergency Medicine | Admitting: Emergency Medicine

## 2018-08-31 ENCOUNTER — Other Ambulatory Visit: Payer: Self-pay

## 2018-08-31 ENCOUNTER — Encounter (HOSPITAL_COMMUNITY): Payer: Self-pay | Admitting: *Deleted

## 2018-08-31 DIAGNOSIS — J069 Acute upper respiratory infection, unspecified: Secondary | ICD-10-CM | POA: Insufficient documentation

## 2018-08-31 DIAGNOSIS — Z79899 Other long term (current) drug therapy: Secondary | ICD-10-CM | POA: Insufficient documentation

## 2018-08-31 NOTE — ED Triage Notes (Signed)
Pt states that she was diagnosed with the flu a few days ago and was told to return to the er if not any better, c/o cough and vomiting,

## 2018-08-31 NOTE — ED Provider Notes (Signed)
[facility] EMERGENCY DEPARTMENT Provider Note   CSN: [id number] Arrival date & time: 08/31/18  2252    History   Chief Complaint Chief Complaint  Patient presents with  . Follow-up    HPI Elizabeth Wilson is a 18 y.o. female.     Patient is a 18 year old female who presents to the emergency department with complaint of worsening flu symptoms.  The patient states that she was seen here in the emergency department 4 days ago.  She was diagnosed with influenza-like illness.  She was placed on Tamiflu.  The patient states at that time she was bothered with vomiting, cough, fever, and diarrhea.  She continues now to have vomiting, fever, and cough.  She says on last evening she was noted to have a temperature of 101.  She had the flu shot this year.  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  She says that she has siblings who have been diagnosed with strep, and with upper respiratory infections.  The patient has not had excessive shortness of breath.  She says she has an occasional wheeze at bedtime.  She is not been traveling outside the country.  She has not been to any of the coronavirus hotspots in the [place], and she has not had contact with any known coronavirus patients.  She presents now for reevaluation.  The history is provided by the patient.    Past Medical History:  Diagnosis Date  . Atrial myxoma   . Atypical chest pain   . Depression   . 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 Seizures [facility])     Patient Active Problem List   Diagnosis Date Noted  . Depression 03/08/2017  . Yeast infection 03/08/2017  . Vaginal itching 03/08/2017    Past Surgical History:  Procedure Laterality Date  . No surgical history       OB History    Gravida  0   Para  0   Term  0   Preterm  0   AB  0   Living  0     SAB  0   TAB  0   Ectopic  0   Multiple  0   Live Births  0            Home Medications    Prior to  Admission medications   Medication Sig Start Date End Date Taking? Authorizing Provider  benzonatate (TESSALON) 100 MG capsule Take 2 capsules (200 mg total) by mouth 3 (three) times daily as needed. 06/10/18   Evalee Jefferson, PA-C  ibuprofen (ADVIL,MOTRIN) 400 MG tablet Take 1 tablet (400 mg total) by mouth every 6 (six) hours as needed. 08/27/18   Triplett, Tammy, PA-C  medroxyPROGESTERone (DEPO-PROVERA) 150 MG/ML injection Inject 150 mg into the muscle every 3 (three) months.    [provider]  methocarbamol (ROBAXIN) 500 MG tablet Take 1 tablet (500 mg total) by mouth every 8 (eight) hours as needed for muscle spasms. 07/28/18   Rancour, Annie Main, MD  naproxen (NAPROSYN) 500 MG tablet Take 1 tablet (500 mg total) by mouth 2 (two) times daily as needed. 07/28/18   Rancour, Annie Main, MD  ondansetron (ZOFRAN ODT) 4 MG disintegrating tablet Take 1 tablet (4 mg total) by mouth every 8 (eight) hours as needed for nausea or vomiting. 06/10/18   Idol, Almyra Free, PA-C  ondansetron (ZOFRAN) 4 MG tablet Take 1 tablet (4 mg total) by mouth every  6 (six) hours. Patient not taking: Reported on 05/06/2018 04/22/18   Nat Christen, MD  oseltamivir (TAMIFLU) 75 MG capsule Take 1 capsule (75 mg total) by mouth 2 (two) times daily. 08/27/18   Triplett, Tammy, PA-C  potassium chloride SA (K-DUR,KLOR-CON) 20 MEQ tablet Take 1 tablet (20 mEq total) by mouth 2 (two) times daily. 06/10/18   Evalee Jefferson, PA-C  traMADol (ULTRAM) 50 MG tablet Take 1 every 6-8 hours for pain not relieved by Tylenol or Motrin 05/06/18   Milton Ferguson, MD    Family History Family History  Problem Relation Age of Onset  . Cancer Paternal Grandfather   . Lung cancer Paternal Grandmother   . Congestive Heart Failure Maternal Grandmother   . Heart attack Maternal Grandmother   . Other Maternal Grandmother        Tumor; shrinking blood vessels to brain  . Hypertension Mother   . Anxiety disorder Mother   . Asthma Brother     Social History  Social History   Tobacco Use  . Smoking status: Never Smoker  . Smokeless tobacco: Never Used  Substance Use Topics  . Alcohol use: No  . Drug use: No     Allergies   Patient has no known allergies.   Review of Systems Review of Systems  Constitutional: Positive for fever. Negative for activity change.       All ROS Neg except as noted in HPI  HENT: Negative for nosebleeds.   Eyes: Negative for photophobia and discharge.  Respiratory: Positive for cough. Negative for shortness of breath and wheezing.   Cardiovascular: Negative for chest pain and palpitations.  Gastrointestinal: Positive for vomiting. Negative for abdominal pain and blood in stool.  Genitourinary: Negative for dysuria, frequency and hematuria.  Musculoskeletal: Negative for arthralgias, back pain and neck pain.  Skin: Negative.   Neurological: Negative for dizziness, seizures and speech difficulty.  Psychiatric/Behavioral: Negative for confusion and hallucinations.     Physical Exam Updated Vital Signs BP 104/76   Pulse 85   Temp 98.2 F (36.8 C) (Oral)   Resp 16   Ht 5\' 3"  (1.6 m)   Wt 47.6 kg   SpO2 100%   BMI 18.59 kg/m   Physical Exam Vitals signs and nursing note reviewed.  Constitutional:      Appearance: She is well-developed. She is not toxic-appearing.  HENT:     Head: Normocephalic.     Right Ear: Tympanic membrane and external ear normal.     Left Ear: Tympanic membrane and external ear normal.  Eyes:     General: Lids are normal.     Pupils: Pupils are equal, round, and reactive to light.  Neck:     Musculoskeletal: Normal range of motion and neck supple.     Vascular: No carotid bruit.  Cardiovascular:     Rate and Rhythm: Normal rate and regular rhythm.     Pulses: Normal pulses.     Heart sounds: Normal heart sounds.  Pulmonary:     Effort: No respiratory distress.     Breath sounds: Normal breath sounds.  Abdominal:     General: Bowel sounds are normal.      Palpations: Abdomen is soft.     Tenderness: There is no abdominal tenderness. There is no guarding.  Musculoskeletal: Normal range of motion.  Lymphadenopathy:     Head:     Right side of head: No submandibular adenopathy.     Left side of head: No submandibular adenopathy.  Cervical: No cervical adenopathy.  Skin:    General: Skin is warm and dry.  Neurological:     Mental Status: She is alert and oriented to person, place, and time.     Cranial Nerves: No cranial nerve deficit.     Sensory: No sensory deficit.  Psychiatric:        Speech: Speech normal.      ED Treatments / Results  Labs (all labs ordered are listed, but only abnormal results are displayed) Labs Reviewed - No data to display  EKG None  Radiology No results found.  Procedures Procedures (including critical care time)  Medications Ordered in ED Medications - No data to display   Initial Impression / Assessment and Plan / ED Course  I have reviewed the triage vital signs and the nursing notes.  Pertinent labs & imaging results that were available during my care of the patient were reviewed by me and considered in my medical decision making (see chart for details).         MDM Vital signs are within normal limits.  Patient is awake and alert and in no distress.  There is no vomiting while here in the emergency department.  Pulse oximetry is 100% on room air.  Within normal limits by my interpretation.  Patient speaks in complete sentences without problem.  She is not using any accessory muscles for breathing.  History and examination favor upper respiratory infection.  I discussed with the patient that she may notice other symptoms even after her influenza has resolved.  We also discussed the fact that she could have more than 1 illness at the same time.  I have asked the patient to continue to use her mask until symptoms have resolved.  Wash hands frequently.  He wipe off surfaces as needed.   Have asked her to increase fluids.  Prescription for Zofran given for the nausea.  Patient is to use Tylenol every 4 hours or ibuprofen every 6 hours for fever, aching, or chills.  The patient is to see her primary physician or return to the emergency department if any worsening of symptoms, problems, or concerns.  Patient is in agreement with this plan.   Final diagnoses:  Viral upper respiratory tract infection    ED Discharge Orders         Ordered    ondansetron (ZOFRAN) 4 MG tablet  Every 6 hours     09/01/18 0004           Lily Kocher, PA-C 09/01/18 0023    Mesner, Corene Cornea, MD 09/01/18 [phone]

## 2018-08-31 NOTE — ED Notes (Signed)
Pt in room with call light within reach. Nothing needed at this time.

## 2018-09-01 MED ORDER — ONDANSETRON HCL 4 MG PO TABS
4.0000 mg | ORAL_TABLET | Freq: Once | ORAL | Status: AC
  Administered 2018-09-01: 4 mg via ORAL
  Filled 2018-09-01: qty 1

## 2018-09-01 MED ORDER — ONDANSETRON HCL 4 MG PO TABS: 4.0000 mg | ORAL_TABLET | Freq: Four times a day (QID) | ORAL | 0 refills | Status: DC

## 2018-09-01 NOTE — Discharge Instructions (Addendum)
Your vital signs are within normal limits.  Your oxygen level is 100% on room air.  Within normal limits by my interpretation.  Your examination favors continued upper respiratory type infection.  Please wash your hands frequently.  Usual mask until symptoms have resolved.  Use Tylenol every 4 hours or ibuprofen every 6 hours for fever, and/or aching.  Please use Zofran every 6 hours as needed for nausea/vomiting.  Please increase fluids.  See your primary physician or return to the emergency department if not improving.

## 2018-10-07 ENCOUNTER — Emergency Department (HOSPITAL_COMMUNITY)
Admission: EM | Admit: 2018-10-07 | Discharge: 2018-10-08 | Disposition: A | Discharge status: Home / Self Care | Payer: Self-pay | Attending: Emergency Medicine | Admitting: Emergency Medicine

## 2018-10-07 ENCOUNTER — Encounter (HOSPITAL_COMMUNITY): Payer: Self-pay | Admitting: *Deleted

## 2018-10-07 ENCOUNTER — Other Ambulatory Visit: Payer: Self-pay

## 2018-10-07 DIAGNOSIS — M62830 Muscle spasm of back: Secondary | ICD-10-CM | POA: Insufficient documentation

## 2018-10-07 LAB — POC URINE PREG, ED: Preg Test, Ur: NEGATIVE

## 2018-10-07 NOTE — ED Notes (Signed)
ED Provider at bedside. 

## 2018-10-07 NOTE — ED Triage Notes (Signed)
Pt was taking out the trash yesterday at work when she felt something pull in her lower back, c/o pain in  Lower back region that radiates down left thigh, denies any urinary symptoms,

## 2018-10-08 LAB — URINALYSIS, ROUTINE W REFLEX MICROSCOPIC
Bilirubin Urine: NEGATIVE
Glucose, UA: NEGATIVE mg/dL
Hgb urine dipstick: NEGATIVE
Ketones, ur: NEGATIVE mg/dL
Nitrite: NEGATIVE
Protein, ur: NEGATIVE mg/dL
Specific Gravity, Urine: 1.024 (ref 1.005–1.030)
pH: 5 (ref 5.0–8.0)

## 2018-10-08 MED ORDER — CYCLOBENZAPRINE HCL 10 MG PO TABS: 10.0000 mg | ORAL_TABLET | Freq: Two times a day (BID) | ORAL | 0 refills | Status: DC | PRN

## 2018-10-08 NOTE — ED Notes (Signed)
Pt ambulatory to waiting room. Pt verbalized understanding of discharge instructions.   

## 2018-10-08 NOTE — Discharge Instructions (Addendum)

## 2018-10-08 NOTE — ED Provider Notes (Signed)
[facility] EMERGENCY DEPARTMENT Provider Note   CSN: [id number] Arrival date & time: 10/07/18  2302    History   Chief Complaint Chief Complaint  Patient presents with  . Back Pain    HPI Elizabeth Wilson is a 18 y.o. female.     The history is provided by the patient.  Back Pain  Location:  Lumbar spine Quality: pulling and numbness. Radiates to:  L thigh Pain severity:  Moderate Onset quality:  Gradual Duration:  1 day Timing:  Intermittent Progression:  Worsening Chronicity:  New Relieved by: rest. Worsened by:  Ambulation (movement) Associated symptoms: no abdominal pain, no bladder incontinence, no dysuria, no fever and no weakness    Patient presents for back pain.  She reports heavy lifting at work, then had onset of back pain with some radiation to her left thigh.  She is able to ambulate.  No new weakness.  No incontinence. Past Medical History:  Diagnosis Date  . Atrial myxoma   . Atypical chest pain   . Depression   . 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 Seizures [facility])     Patient Active Problem List   Diagnosis Date Noted  . Depression 03/08/2017  . Yeast infection 03/08/2017  . Vaginal itching 03/08/2017    Past Surgical History:  Procedure Laterality Date  . No surgical history       OB History    Gravida  0   Para  0   Term  0   Preterm  0   AB  0   Living  0     SAB  0   TAB  0   Ectopic  0   Multiple  0   Live Births  0            Home Medications    Prior to Admission medications   Medication Sig Start Date End Date Taking? Authorizing Provider  cyclobenzaprine (FLEXERIL) 10 MG tablet Take 1 tablet (10 mg total) by mouth 2 (two) times daily as needed for muscle spasms. 10/08/18   Ripley Fraise, MD  medroxyPROGESTERone (DEPO-PROVERA) 150 MG/ML injection Inject 150 mg into the muscle every 3 (three) months.    [provider]    Family History Family History  Problem Relation Age of Onset  . Cancer  Paternal Grandfather   . Lung cancer Paternal Grandmother   . Congestive Heart Failure Maternal Grandmother   . Heart attack Maternal Grandmother   . Other Maternal Grandmother        Tumor; shrinking blood vessels to brain  . Hypertension Mother   . Anxiety disorder Mother   . Asthma Brother     Social History Social History   Tobacco Use  . Smoking status: Never Smoker  . Smokeless tobacco: Never Used  Substance Use Topics  . Alcohol use: No  . Drug use: No     Allergies   Patient has no known allergies.   Review of Systems Review of Systems  Constitutional: Negative for fever.  Respiratory: Negative for cough.   Gastrointestinal: Negative for abdominal pain.  Genitourinary: Negative for bladder incontinence and dysuria.  Musculoskeletal: Positive for back pain.  Neurological: Negative for weakness.  All other systems reviewed and are negative.    Physical Exam Updated Vital Signs BP 108/70   Pulse 81   Temp 98.1 F (36.7 C) (Oral)   Resp 16   Ht 1.6 m (5\' 3" )   Wt 49 kg  LMP 08/27/2018   SpO2 99%   BMI 19.13 kg/m   Physical Exam CONSTITUTIONAL: Well developed/well nourished HEAD: Normocephalic/atraumatic EYES: EOMI/PERRL ENMT: Mucous membranes moist NECK: supple no meningeal signs SPINE/BACK:diffuse lumbar spinal/paraspinal tenderness, No bruising/crepitance/stepoffs noted to spine CV: S1/S2 noted, no murmurs/rubs/gallops noted LUNGS: Lungs are clear to auscultation bilaterally, no apparent distress ABDOMEN: soft, nontender, no rebound or guarding GU:no cva tenderness NEURO: Awake/alert,  equal motor 5/5 strength noted with the following: hip flexion/knee flexion/extension, foot dorsi/plantar flexion, great toe extension intact bilaterally, no clonus bilaterally, no sensory deficit in any dermatome.  Equal patellar/achilles reflex noted (2+) in bilateral lower extremities.  Pt is able to ambulate unassisted. EXTREMITIES: pulses normal, full ROM  SKIN: warm, color normal PSYCH: no abnormalities of mood noted, alert and oriented to situation    ED Treatments / Results  Labs (all labs ordered are listed, but only abnormal results are displayed) Labs Reviewed  URINALYSIS, ROUTINE W REFLEX MICROSCOPIC - Abnormal; Notable for the following components:      Result Value   Leukocytes,Ua TRACE (*)    Bacteria, UA RARE (*)    All other components within normal limits  POC URINE PREG, ED    EKG None  Radiology No results found.  Procedures Procedures   Medications Ordered in ED Medications - No data to display   Initial Impression / Assessment and Plan / ED Course  I have reviewed the triage vital signs and the nursing notes.  Pertinent labs  results that were available during my care of the patient were reviewed by me and considered in my medical decision making (see chart for details).        12:09 AM Pt declines meds at this time   Pt well appearing Will d/c home   Final Clinical Impressions(s) / ED Diagnoses   Final diagnoses:  Back spasm    ED Discharge Orders         Ordered    cyclobenzaprine (FLEXERIL) 10 MG tablet  2 times daily PRN     10/08/18 0006           Ripley Fraise, MD 10/08/18 [phone]

## 2019-03-25 ENCOUNTER — Other Ambulatory Visit: Payer: Self-pay

## 2019-03-25 ENCOUNTER — Encounter (HOSPITAL_COMMUNITY): Payer: Self-pay | Admitting: Emergency Medicine

## 2019-03-25 ENCOUNTER — Emergency Department (HOSPITAL_COMMUNITY)
Admission: EM | Admit: 2019-03-25 | Discharge: 2019-03-25 | Disposition: A | Discharge status: Home / Self Care | Payer: Self-pay | Attending: Emergency Medicine | Admitting: Emergency Medicine

## 2019-03-25 DIAGNOSIS — M79604 Pain in right leg: Secondary | ICD-10-CM | POA: Insufficient documentation

## 2019-03-25 DIAGNOSIS — Z793 Long term (current) use of hormonal contraceptives: Secondary | ICD-10-CM | POA: Insufficient documentation

## 2019-03-25 DIAGNOSIS — M79605 Pain in left leg: Secondary | ICD-10-CM | POA: Insufficient documentation

## 2019-03-25 LAB — I-STAT BETA HCG BLOOD, ED (MC, WL, AP ONLY): I-stat hCG, quantitative: <5 m[IU]/mL (ref <5)

## 2019-03-25 NOTE — ED Provider Notes (Signed)
[facility] EMERGENCY DEPARTMENT Provider Note   CSN: [id number] Arrival date & time: 03/25/19  1458     History   Chief Complaint Chief Complaint  Patient presents with  . Leg Pain    HPI Elizabeth Wilson is a 18 y.o. female.     HPI Patient presents with concern of bilateral leg heaviness. Patient states that she is generally well, takes no medication regularly, does not smoke, does not drink very non-she notes that she typically has some degree of leg soreness. However, about 2 days ago she noticed more soreness, fullness than usual in both legs, possibly more on the right.  No swelling, no erythema, no loss of sensation. Yesterday, while walking at work she felt discomfort substantial enough to cause her to fall to the ground. No loss of consciousness. It is unclear if she is taking any medication for her symptoms.   Past Medical History:  Diagnosis Date  . Atrial myxoma   . Atypical chest pain   . Depression   . 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 Seizures [facility])     Patient Active Problem List   Diagnosis Date Noted  . Depression 03/08/2017  . Yeast infection 03/08/2017  . Vaginal itching 03/08/2017    Past Surgical History:  Procedure Laterality Date  . No surgical history       OB History    Gravida  0   Para  0   Term  0   Preterm  0   AB  0   Living  0     SAB  0   TAB  0   Ectopic  0   Multiple  0   Live Births  0            Home Medications    Prior to Admission medications   Medication Sig Start Date End Date Taking? Authorizing Provider  cyclobenzaprine (FLEXERIL) 10 MG tablet Take 1 tablet (10 mg total) by mouth 2 (two) times daily as needed for muscle spasms. 10/08/18   Ripley Fraise, MD  medroxyPROGESTERone (DEPO-PROVERA) 150 MG/ML injection Inject 150 mg into the muscle every 3 (three) months.    [provider]    Family History Family History  Problem Relation Age of Onset  . Cancer Paternal Grandfather    . Lung cancer Paternal Grandmother   . Congestive Heart Failure Maternal Grandmother   . Heart attack Maternal Grandmother   . Other Maternal Grandmother        Tumor; shrinking blood vessels to brain  . Hypertension Mother   . Anxiety disorder Mother   . Asthma Brother     Social History Social History   Tobacco Use  . Smoking status: Never Smoker  . Smokeless tobacco: Never Used  Substance Use Topics  . Alcohol use: No  . Drug use: No     Allergies   Patient has no known allergies.   Review of Systems Review of Systems  Constitutional:       Per HPI, otherwise negative  HENT:       Per HPI, otherwise negative  Respiratory:       Per HPI, otherwise negative  Cardiovascular:       Per HPI, otherwise negative  Gastrointestinal: Negative for vomiting.  Endocrine:       Negative aside from HPI  Genitourinary:       Neg aside from HPI   Musculoskeletal:       Per HPI, otherwise  negative  Skin: Negative.   Neurological: Negative for syncope.     Physical Exam Updated Vital Signs BP 112/70 (BP Location: Right Arm)   Pulse 65   Temp 98.6 F (37 C) (Oral)   Resp 18   Ht 5\' 3"  (1.6 m)   Wt 52.2 kg   LMP 02/05/2019   SpO2 100%   BMI 20.37 kg/m   Physical Exam Vitals signs and nursing note reviewed.  Constitutional:      General: She is not in acute distress.    Appearance: She is well-developed.  HENT:     Head: Normocephalic and atraumatic.  Eyes:     Conjunctiva/sclera: Conjunctivae normal.  Cardiovascular:     Rate and Rhythm: Normal rate and regular rhythm.  Pulmonary:     Effort: Pulmonary effort is normal. No respiratory distress.     Breath sounds: Normal breath sounds. No stridor.  Abdominal:     General: There is no distension.  Musculoskeletal:        General: No tenderness, deformity or signs of injury.     Right lower leg: No edema.     Left lower leg: No edema.     Comments: Lower extremity exam unremarkable, symmetric strength,  size, no erythema, no tenderness to palpation, no edema.  Skin:    General: Skin is warm and dry.  Neurological:     Mental Status: She is alert and oriented to person, place, and time.     Cranial Nerves: No cranial nerve deficit.      ED Treatments / Results  Labs (all labs ordered are listed, but only abnormal results are displayed) Labs Reviewed  I-STAT CHEM 8, ED  I-STAT BETA HCG BLOOD, ED ([facility], [facility], [facility] ONLY)    Procedures Procedures (including critical care time)  Medications Ordered in ED Medications - No data to display   Initial Impression / Assessment and Plan / ED Course  I have reviewed the triage vital signs and the nursing notes.  Pertinent labs & imaging results that were available during my care of the patient were reviewed by me and considered in my medical decision making (see chart for details).  hCG negative, and initial lab draw was complicated by twice malfunction.  On 1 staff went back to draw additional blood for evaluation, the patient had departed, prior to me having any initial discussion with her, more staff able to find her. Though the patient's physical exam was reassuring, absent additional evaluation, she did leave prior to completion of her exam.  1  Final Clinical Impressions(s) / ED Diagnoses   Final diagnoses:  Bilateral leg pain     Carmin Muskrat, MD 03/25/19 1943

## 2019-03-25 NOTE — ED Notes (Signed)
Pt not in room, not in lobby

## 2019-03-25 NOTE — ED Triage Notes (Signed)
Pt states she works at [employer] in [employer] and for the past 2 days her legs have started to feel heavy at the end of her shift and they ache.

## 2019-03-25 NOTE — ED Notes (Signed)
No answer in waiting room, no pt in room

## 2019-03-25 NOTE — ED Notes (Signed)
Not in room

## 2019-07-15 ENCOUNTER — Other Ambulatory Visit: Payer: Self-pay

## 2019-07-15 DIAGNOSIS — K2900 Acute gastritis without bleeding: Secondary | ICD-10-CM | POA: Insufficient documentation

## 2019-07-15 DIAGNOSIS — Z79899 Other long term (current) drug therapy: Secondary | ICD-10-CM | POA: Insufficient documentation

## 2019-07-16 ENCOUNTER — Emergency Department (HOSPITAL_COMMUNITY)
Admission: EM | Admit: 2019-07-16 | Discharge: 2019-07-16 | Disposition: A | Discharge status: Home / Self Care | Payer: Self-pay | Attending: Emergency Medicine | Admitting: Emergency Medicine

## 2019-07-16 ENCOUNTER — Encounter (HOSPITAL_COMMUNITY): Payer: Self-pay | Admitting: Emergency Medicine

## 2019-07-16 DIAGNOSIS — K2900 Acute gastritis without bleeding: Secondary | ICD-10-CM

## 2019-07-16 LAB — PREGNANCY, URINE: Preg Test, Ur: NEGATIVE

## 2019-07-16 MED ORDER — PANTOPRAZOLE SODIUM 40 MG PO TBEC: 40.0000 mg | DELAYED_RELEASE_TABLET | Freq: Every day | ORAL | Status: DC

## 2019-07-16 MED ORDER — OMEPRAZOLE 20 MG PO CPDR: 20.0000 mg | DELAYED_RELEASE_CAPSULE | Freq: Two times a day (BID) | ORAL | 0 refills | Status: DC

## 2019-07-16 MED ORDER — SUCRALFATE 1 GM/10ML PO SUSP: 1.0000 g | Freq: Three times a day (TID) | ORAL | 0 refills | Status: DC

## 2019-07-16 MED ORDER — LIDOCAINE VISCOUS HCL 2 % MT SOLN
15.0000 mL | Freq: Once | OROMUCOSAL | Status: AC
  Administered 2019-07-16: 15 mL via ORAL
  Filled 2019-07-16: qty 15

## 2019-07-16 MED ORDER — ALUM & MAG HYDROXIDE-SIMETH 200-200-20 MG/5ML PO SUSP
30.0000 mL | Freq: Once | ORAL | Status: AC
  Administered 2019-07-16: 30 mL via ORAL
  Filled 2019-07-16: qty 30

## 2019-07-16 MED ORDER — ONDANSETRON 4 MG PO TBDP: 4.0000 mg | ORAL_TABLET | Freq: Three times a day (TID) | ORAL | 0 refills | Status: DC | PRN

## 2019-07-16 NOTE — Discharge Instructions (Addendum)
We suspect that your symptoms are because of gastritis. Please start taking the medication as prescribed.  It is prudent that you follow-up with your primary care doctor in 7 to 14 days for further work-up.  Return to the ER immediately with start having severe pain, bloody vomit, bloody stools.

## 2019-07-16 NOTE — ED Triage Notes (Signed)
Pt states that she has burning in her stomach and esophagus x 4 weeks whenever she tries to eat anything. Pt states she tried to eat a pizza tonight after not eating for a week and "just gave up and came here". Pt states she tried to get in with PCP in [place] but "they were too full to see her". Pt has tried Tylenol, milk, and Pepto for discomfort.

## 2019-07-21 NOTE — ED Provider Notes (Signed)
[facility] EMERGENCY DEPARTMENT Provider Note   CSN: [id number] Arrival date & time: 07/15/19  2355     History Chief Complaint  Patient presents with  . Gastroesophageal Reflux    Elizabeth Wilson is a 19 y.o. female.  HPI    19 year old with history of seizures comes in a chief complaint of epigastric abdominal pain. Patient reports that she has been having abdominal pain for the last several days.  The pain is described as burning in nature and it radiates up to her chest.  Patient's pain is worse with any type of oral intake.  She denies radiation of the pain.  There is no associated bloody stools, bloody emesis.  Patient has been taking medications prescribed and it is not providing significant relief.  She has not been diagnosed with gastritis.  She denies any heavy alcohol or NSAID use.  Past Medical History:  Diagnosis Date  . Atrial myxoma   . Atypical chest pain   . Depression   . 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 Seizures [facility])     Patient Active Problem List   Diagnosis Date Noted  . Depression 03/08/2017  . Yeast infection 03/08/2017  . Vaginal itching 03/08/2017    Past Surgical History:  Procedure Laterality Date  . No surgical history       OB History    Gravida  0   Para  0   Term  0   Preterm  0   AB  0   Living  0     SAB  0   TAB  0   Ectopic  0   Multiple  0   Live Births  0           Family History  Problem Relation Age of Onset  . Cancer Paternal Grandfather   . Lung cancer Paternal Grandmother   . Congestive Heart Failure Maternal Grandmother   . Heart attack Maternal Grandmother   . Other Maternal Grandmother        Tumor; shrinking blood vessels to brain  . Hypertension Mother   . Anxiety disorder Mother   . Asthma Brother     Social History   Tobacco Use  . Smoking status: Never Smoker  . Smokeless tobacco: Never Used  Substance Use Topics  . Alcohol use: No  . Drug use: No    Home Medications Prior  to Admission medications   Medication Sig Start Date End Date Taking? Authorizing Provider  cyclobenzaprine (FLEXERIL) 10 MG tablet Take 1 tablet (10 mg total) by mouth 2 (two) times daily as needed for muscle spasms. 10/08/18   Ripley Fraise, MD  medroxyPROGESTERone (DEPO-PROVERA) 150 MG/ML injection Inject 150 mg into the muscle every 3 (three) months.    [provider]  omeprazole (PRILOSEC) 20 MG capsule Take 1 capsule (20 mg total) by mouth 2 (two) times daily before a meal. 07/16/19   Kathrynn Humble, Abdifatah Colquhoun, MD  ondansetron (ZOFRAN ODT) 4 MG disintegrating tablet Take 1 tablet (4 mg total) by mouth every 8 (eight) hours as needed for nausea or vomiting. 07/16/19   Varney Biles, MD  sucralfate (CARAFATE) 1 GM/10ML suspension Take 10 mLs (1 g total) by mouth 4 (four) times daily -  with meals and at bedtime. 07/16/19   Varney Biles, MD    Allergies    Patient has no known allergies.  Review of Systems   Review of Systems  Constitutional: Positive for activity change.  Cardiovascular: Positive for  chest pain.  Gastrointestinal: Positive for abdominal pain and nausea. Negative for vomiting.  Allergic/Immunologic: Negative for immunocompromised state.  Hematological: Does not bruise/bleed easily.  All other systems reviewed and are negative.   Physical Exam Updated Vital Signs BP 115/78   Pulse 65   Temp 98.3 F (36.8 C) (Oral)   Resp 16   Ht 5\' 3"  (1.6 m)   Wt 47.6 kg   LMP 07/08/2019   SpO2 100%   BMI 18.60 kg/m   Physical Exam Vitals and nursing note reviewed.  Constitutional:      Appearance: She is well-developed.  HENT:     Head: Normocephalic and atraumatic.  Eyes:     Pupils: Pupils are equal, round, and reactive to light.  Cardiovascular:     Rate and Rhythm: Normal rate and regular rhythm.     Heart sounds: Normal heart sounds. No murmur.  Pulmonary:     Effort: Pulmonary effort is normal. No respiratory distress.  Abdominal:     General: There is no  distension.     Palpations: Abdomen is soft.     Tenderness: There is abdominal tenderness. There is no guarding or rebound.  Musculoskeletal:     Cervical back: Neck supple.  Skin:    General: Skin is warm and dry.  Neurological:     Mental Status: She is alert and oriented to person, place, and time.     ED Results / Procedures / Treatments   Labs (all labs ordered are listed, but only abnormal results are displayed) Labs Reviewed  PREGNANCY, URINE    EKG None  Radiology No results found.  Procedures Procedures (including critical care time)  Medications Ordered in ED Medications  alum & mag hydroxide-simeth (MAALOX/MYLANTA) 200-200-20 MG/5ML suspension 30 mL (30 mLs Oral Given 07/16/19 0049)    And  lidocaine (XYLOCAINE) 2 % viscous mouth solution 15 mL (15 mLs Oral Given 07/16/19 0049)    ED Course  I have reviewed the triage vital signs and the nursing notes.  Pertinent labs & imaging results that were available during my care of the patient were reviewed by me and considered in my medical decision making (see chart for details).    MDM Rules/Calculators/A&P                      19 year old comes in a chief complaint of abdominal pain. Patient has epigastric abdominal pain.  Symptoms have been present for last several days and typically worse with p.o. intake.  Suspect gastritis.  No IBD history in the family.  Does not appear to be pancreatitis or cholecystitis. There is negative Murphy's.  We will concentrate on symptom control and give her GI follow-up.  Final Clinical Impression(s) / ED Diagnoses Final diagnoses:  Acute gastritis without hemorrhage, unspecified gastritis type    Rx / DC Orders ED Discharge Orders         Ordered    omeprazole (PRILOSEC) 20 MG capsule  2 times daily before meals     07/16/19 0154    sucralfate (CARAFATE) 1 GM/10ML suspension  3 times daily with meals & bedtime     07/16/19 0154    ondansetron (ZOFRAN ODT) 4 MG  disintegrating tablet  Every 8 hours PRN     07/16/19 0155           Varney Biles, MD 07/21/19 [id number]

## 2019-08-27 NOTE — ED Provider Notes (Signed)
[facility] EMERGENCY DEPARTMENT Provider Note   CSN: [id number] Arrival date & time: 07/15/19  2355     History Chief Complaint  Patient presents with  . Gastroesophageal Reflux    Elizabeth Wilson is a 19 y.o. female.  HPI     19 year old female with history of heart murmur, myxoma, seizures comes in a chief complaint of epigastric pain. Patient reports that she been having burning in her upper chest for the last 4 weeks.  Her symptoms are worse after she eats.  Tonight she had pizza and she was unable to sleep because of discomfort and decided to come to the ER.  She has tried to see her PCP but they were unable to follow-up.  She has been taking over-the-counter medications including Pepto-Bismol without any significant relief.  Patient has no history of ulcers and denies any family history of IBD, new rash, new arthralgia.  She also denies any bloody stools. Past Medical History:  Diagnosis Date  . Atrial myxoma   . Atypical chest pain   . Depression   . 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 Seizures [facility])     Patient Active Problem List   Diagnosis Date Noted  . Depression 03/08/2017  . Yeast infection 03/08/2017  . Vaginal itching 03/08/2017    Past Surgical History:  Procedure Laterality Date  . No surgical history       OB History    Gravida  0   Para  0   Term  0   Preterm  0   AB  0   Living  0     SAB  0   TAB  0   Ectopic  0   Multiple  0   Live Births  0           Family History  Problem Relation Age of Onset  . Cancer Paternal Grandfather   . Lung cancer Paternal Grandmother   . Congestive Heart Failure Maternal Grandmother   . Heart attack Maternal Grandmother   . Other Maternal Grandmother        Tumor; shrinking blood vessels to brain  . Hypertension Mother   . Anxiety disorder Mother   . Asthma Brother     Social History   Tobacco Use  . Smoking status: Never Smoker  . Smokeless tobacco: Never Used  Substance Use  Topics  . Alcohol use: No  . Drug use: No    Home Medications Prior to Admission medications   Medication Sig Start Date End Date Taking? Authorizing Provider  cyclobenzaprine (FLEXERIL) 10 MG tablet Take 1 tablet (10 mg total) by mouth 2 (two) times daily as needed for muscle spasms. 10/08/18   Ripley Fraise, MD  medroxyPROGESTERone (DEPO-PROVERA) 150 MG/ML injection Inject 150 mg into the muscle every 3 (three) months.    [provider]  omeprazole (PRILOSEC) 20 MG capsule Take 1 capsule (20 mg total) by mouth 2 (two) times daily before a meal. 07/16/19   Kathrynn Humble, Strummer Canipe, MD  ondansetron (ZOFRAN ODT) 4 MG disintegrating tablet Take 1 tablet (4 mg total) by mouth every 8 (eight) hours as needed for nausea or vomiting. 07/16/19   Varney Biles, MD  sucralfate (CARAFATE) 1 GM/10ML suspension Take 10 mLs (1 g total) by mouth 4 (four) times daily -  with meals and at bedtime. 07/16/19   Varney Biles, MD    Allergies    Patient has no known allergies.  Review of Systems   Review  of Systems  Constitutional: Positive for activity change.  Respiratory: Negative for shortness of breath.   Cardiovascular: Negative for chest pain.  Gastrointestinal: Positive for abdominal pain.  Allergic/Immunologic: Negative for immunocompromised state.  Hematological: Does not bruise/bleed easily.  All other systems reviewed and are negative.   Physical Exam Updated Vital Signs BP 115/78   Pulse 65   Temp 98.3 F (36.8 C) (Oral)   Resp 16   Ht 5\' 3"  (1.6 m)   Wt 47.6 kg   LMP 07/08/2019   SpO2 100%   BMI 18.60 kg/m   Physical Exam Vitals and nursing note reviewed.  Constitutional:      Appearance: She is well-developed.  HENT:     Head: Normocephalic and atraumatic.  Cardiovascular:     Rate and Rhythm: Normal rate.  Pulmonary:     Effort: Pulmonary effort is normal.  Abdominal:     General: Bowel sounds are normal.     Tenderness: There is abdominal tenderness. There is no  guarding or rebound.     Comments: Negative Murphy's  Musculoskeletal:     Cervical back: Normal range of motion and neck supple.  Skin:    General: Skin is warm and dry.  Neurological:     Mental Status: She is alert and oriented to person, place, and time.     ED Results / Procedures / Treatments   Labs (all labs ordered are listed, but only abnormal results are displayed) Labs Reviewed  PREGNANCY, URINE    EKG None  Radiology No results found.  Procedures Procedures (including critical care time)  Medications Ordered in ED Medications  alum & mag hydroxide-simeth (MAALOX/MYLANTA) 200-200-20 MG/5ML suspension 30 mL (30 mLs Oral Given 07/16/19 0049)    And  lidocaine (XYLOCAINE) 2 % viscous mouth solution 15 mL (15 mLs Oral Given 07/16/19 0049)    ED Course  I have reviewed the triage vital signs and the nursing notes.  Pertinent labs & imaging results that were available during my care of the patient were reviewed by me and considered in my medical decision making (see chart for details).    MDM Rules/Calculators/A&P                     Elizabeth A Sterry was evaluated in Emergency Department on 08/27/2019 for the symptoms described in the history of present illness. She was evaluated in the context of the global COVID-19 pandemic, which necessitated consideration that the patient might be at risk for infection with the SARS-CoV-2 virus that causes COVID-19. Institutional protocols and algorithms that pertain to the evaluation of patients at risk for COVID-19 are in a state of rapid change based on information released by regulatory bodies including the CDC and federal and state organizations. These policies and algorithms were followed during the patient's care in the ED.   19 year old comes in a chief complaint of upper chest abdominal pain. Her epigastric chest pain is described as burning pain.  The pain radiates up towards her throat.  She has no right upper quadrant  tenderness.  Differential diagnosis includes cholelithiasis, gastritis, esophagitis.  Clinically, suspicion for hepatobiliary process is extremely low given the location of the pain and the radiation up towards her throat. We will ensure that patient gets symptom relief.  No need for emergent labs or work-up at this time.  Patient is comfortable with the plan, and prior to discharge we had discussed strict ER return precautions which included severe nausea, vomiting, worsening  pain, bloody stools or bloody emesis.  Final Clinical Impression(s) / ED Diagnoses Final diagnoses:  Acute gastritis without hemorrhage, unspecified gastritis type    Rx / DC Orders ED Discharge Orders         Ordered    omeprazole (PRILOSEC) 20 MG capsule  2 times daily before meals     07/16/19 0154    sucralfate (CARAFATE) 1 GM/10ML suspension  3 times daily with meals & bedtime     07/16/19 0154    ondansetron (ZOFRAN ODT) 4 MG disintegrating tablet  Every 8 hours PRN     07/16/19 0155           Varney Biles, MD 08/27/19 [phone]

## 2019-09-19 IMAGING — DX DG CHEST 2V
2 series · 2 of 2 positions shown · non-contrast
Comparison: 03/03/2016, 05/09/2016

CLINICAL DATA: Chest pain, epigastric pain

EXAM:
CHEST  2 VIEW

[chest pa]
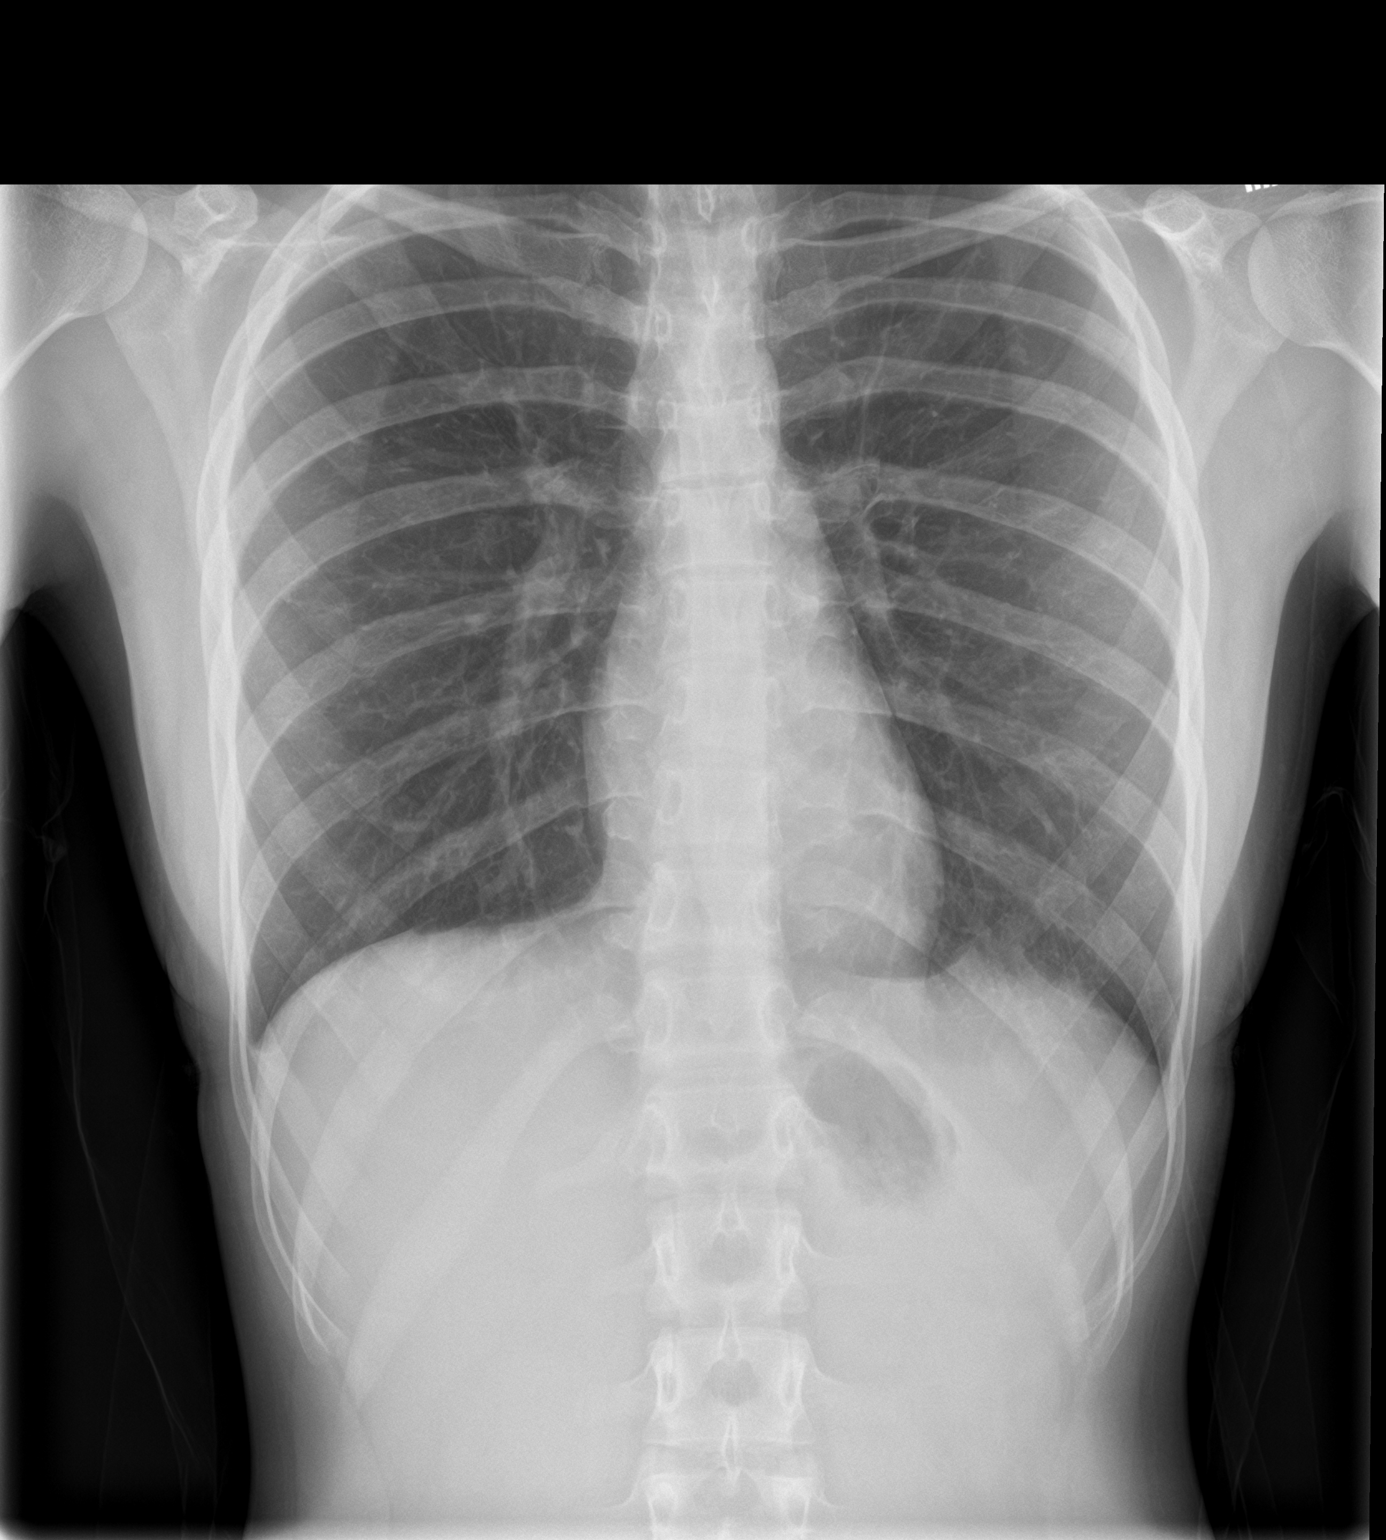

[chest lat]
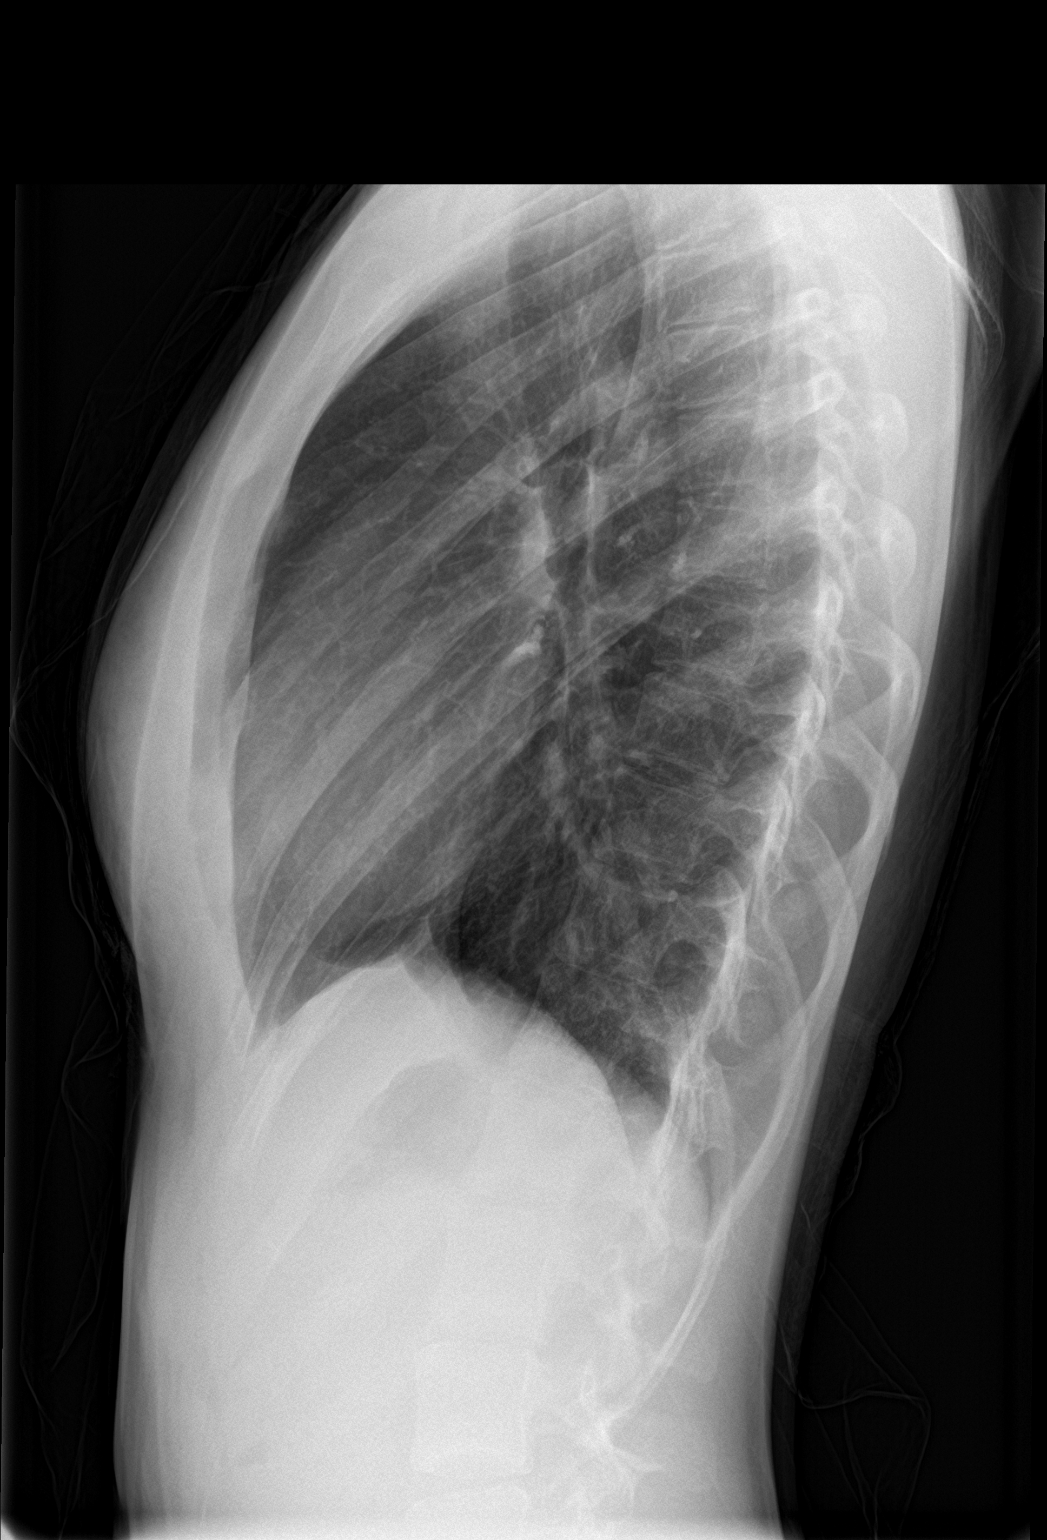

[2 of 2 positions shown; findings below may reference images not displayed]

FINDINGS: The heart size and mediastinal contours are within normal limits.
Both lungs are clear. The visualized skeletal structures are
unremarkable.
IMPRESSION: No active cardiopulmonary disease.

## 2019-09-30 ENCOUNTER — Emergency Department (HOSPITAL_COMMUNITY)
Admission: EM | Admit: 2019-09-30 | Discharge: 2019-09-30 | Disposition: A | Discharge status: Home / Self Care | Payer: Self-pay

## 2019-09-30 ENCOUNTER — Other Ambulatory Visit: Payer: Self-pay

## 2019-10-30 ENCOUNTER — Emergency Department (HOSPITAL_COMMUNITY): Payer: Self-pay

## 2019-10-30 ENCOUNTER — Other Ambulatory Visit: Payer: Self-pay

## 2019-10-30 ENCOUNTER — Encounter (HOSPITAL_COMMUNITY): Payer: Self-pay | Admitting: Emergency Medicine

## 2019-10-30 ENCOUNTER — Emergency Department (HOSPITAL_COMMUNITY)
Admission: EM | Admit: 2019-10-30 | Discharge: 2019-10-30 | Disposition: A | Discharge status: Home / Self Care | Payer: Self-pay | Attending: Emergency Medicine | Admitting: Emergency Medicine

## 2019-10-30 DIAGNOSIS — Z79899 Other long term (current) drug therapy: Secondary | ICD-10-CM | POA: Insufficient documentation

## 2019-10-30 DIAGNOSIS — S46912A Strain of unspecified muscle, fascia and tendon at shoulder and upper arm level, left arm, initial encounter: Secondary | ICD-10-CM | POA: Insufficient documentation

## 2019-10-30 DIAGNOSIS — Y939 Activity, unspecified: Secondary | ICD-10-CM | POA: Insufficient documentation

## 2019-10-30 DIAGNOSIS — Y999 Unspecified external cause status: Secondary | ICD-10-CM | POA: Insufficient documentation

## 2019-10-30 DIAGNOSIS — X58XXXA Exposure to other specified factors, initial encounter: Secondary | ICD-10-CM | POA: Insufficient documentation

## 2019-10-30 DIAGNOSIS — Y929 Unspecified place or not applicable: Secondary | ICD-10-CM | POA: Insufficient documentation

## 2019-10-30 MED ORDER — NAPROXEN 500 MG PO TABS: 500.0000 mg | ORAL_TABLET | Freq: Two times a day (BID) | ORAL | 0 refills | Status: DC

## 2019-10-30 MED ORDER — NAPROXEN 250 MG PO TABS
500.0000 mg | ORAL_TABLET | Freq: Once | ORAL | Status: AC
  Administered 2019-10-30: 500 mg via ORAL
  Filled 2019-10-30: qty 2

## 2019-10-30 NOTE — Discharge Instructions (Signed)
Your xray was normal. We think you have inflammation in a tendon. We are treating you with anti-inflammatories. Rest and ice your shoulder. Return to he ER with any new or worsening symptoms. Otherwise, follow up with your primary care provider or ortho.

## 2019-10-30 NOTE — ED Provider Notes (Signed)
[place] Provider Note   CSN: [id number] Arrival date & time: 10/30/19  1540     History Chief Complaint  Patient presents with  . Chest Pain    Elizabeth Wilson is a 19 y.o. female.  Patient is a 19 year old female with history of seizure disorder presenting to the ER for left shoulder pain for 2 days. Denies injury or trauma. Reports that she has anterior L should pain with swelling which radiates into her chest and to her scapula. Worse with movement of the left arm. Denies SOB, URI symptoms, numbness, tingling, weakness.         Past Medical History:  Diagnosis Date  . Atrial myxoma   . Atypical chest pain   . Depression   . 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 Seizures [facility])     Patient Active Problem List   Diagnosis Date Noted  . Depression 03/08/2017  . Yeast infection 03/08/2017  . Vaginal itching 03/08/2017    Past Surgical History:  Procedure Laterality Date  . No surgical history       OB History    Gravida  0   Para  0   Term  0   Preterm  0   AB  0   Living  0     SAB  0   TAB  0   Ectopic  0   Multiple  0   Live Births  0           Family History  Problem Relation Age of Onset  . Cancer Paternal Grandfather   . Lung cancer Paternal Grandmother   . Congestive Heart Failure Maternal Grandmother   . Heart attack Maternal Grandmother   . Other Maternal Grandmother        Tumor; shrinking blood vessels to brain  . Hypertension Mother   . Anxiety disorder Mother   . Asthma Brother     Social History   Tobacco Use  . Smoking status: Never Smoker  . Smokeless tobacco: Never Used  Substance Use Topics  . Alcohol use: No  . Drug use: No    Home Medications Prior to Admission medications   Medication Sig Start Date End Date Taking? Authorizing Provider  cyclobenzaprine (FLEXERIL) 10 MG tablet Take 1 tablet (10 mg total) by mouth 2 (two) times daily as needed for muscle spasms. 10/08/18   Ripley Fraise, MD  medroxyPROGESTERone (DEPO-PROVERA) 150 MG/ML injection Inject 150 mg into the muscle every 3 (three) months.    [provider]  naproxen (NAPROSYN) 500 MG tablet Take 1 tablet (500 mg total) by mouth 2 (two) times daily. 10/30/19   Alveria Apley, PA-C  omeprazole (PRILOSEC) 20 MG capsule Take 1 capsule (20 mg total) by mouth 2 (two) times daily before a meal. 07/16/19   Kathrynn Humble, Ankit, MD  ondansetron (ZOFRAN ODT) 4 MG disintegrating tablet Take 1 tablet (4 mg total) by mouth every 8 (eight) hours as needed for nausea or vomiting. 07/16/19   Varney Biles, MD  sucralfate (CARAFATE) 1 GM/10ML suspension Take 10 mLs (1 g total) by mouth 4 (four) times daily -  with meals and at bedtime. 07/16/19   Varney Biles, MD    Allergies    Patient has no known allergies.  Review of Systems   Review of Systems  Constitutional: Negative for chills and fever.  HENT: Negative for congestion and sore throat.   Respiratory: Negative for cough and shortness of breath.  Cardiovascular: Positive for chest pain. Negative for palpitations and leg swelling.  Gastrointestinal: Negative for abdominal pain, nausea and vomiting.  Musculoskeletal: Positive for arthralgias and myalgias. Negative for back pain.  Skin: Negative for rash.  Neurological: Negative for numbness.  Hematological: Does not bruise/bleed easily.    Physical Exam Updated Vital Signs BP 123/76 (BP Location: Right Arm)   Pulse 99   Temp 98.8 F (37.1 C) (Oral)   Resp 18   Ht 5\' 3"  (1.6 m)   Wt 49.9 kg   LMP 10/05/2019   SpO2 100%   BMI 19.49 kg/m   Physical Exam Vitals and nursing note reviewed.  Constitutional:      General: She is not in acute distress.    Appearance: Normal appearance. She is well-developed. She is not ill-appearing, toxic-appearing or diaphoretic.  HENT:     Head: Normocephalic.  Eyes:     Conjunctiva/sclera: Conjunctivae normal.     Pupils: Pupils are equal, round, and reactive to  light.  Cardiovascular:     Rate and Rhythm: Normal rate.  Pulmonary:     Effort: Pulmonary effort is normal.     Breath sounds: Normal breath sounds.  Chest:     Chest wall: Tenderness present.  Musculoskeletal:     Right elbow: Normal.     Left elbow: Normal.     Right forearm: Normal.     Left forearm: Normal.     Right wrist: Normal.     Left wrist: Normal.     Comments: TTP with localized swelling over the left bicep tendon. Worse with biceps flexion and any shoulder movement. Joint is not increased with warm. Normal passive ROM of the joint but painful active ROM  Skin:    General: Skin is dry.  Neurological:     Mental Status: She is alert.  Psychiatric:        Mood and Affect: Mood normal.     ED Results / Procedures / Treatments   Labs (all labs ordered are listed, but only abnormal results are displayed) Labs Reviewed - No data to display  EKG None  Radiology DG Shoulder Left  Result Date: 10/30/2019 CLINICAL DATA:  Left shoulder pain with decreased range of motion initial encounter EXAM: LEFT SHOULDER - 2+ VIEW COMPARISON:  None. FINDINGS: There is no evidence of fracture or dislocation. There is no evidence of arthropathy or other focal bone abnormality. Soft tissues are unremarkable. IMPRESSION: No acute abnormality noted. Electronically Signed   By: Inez Catalina M.D.   On: 10/30/2019 16:42    Procedures Procedures (including critical care time)  Medications Ordered in ED Medications  naproxen (NAPROSYN) tablet 500 mg (has no administration in time range)    ED Course  I have reviewed the triage vital signs and the nursing notes.  Pertinent labs & imaging results that were available during my care of the patient were reviewed by me and considered in my medical decision making (see chart for details).  Clinical Course as of Oct 30 1647  Tue Oct 30, 2019  1610 Patient with localized swelling and tenderness over the left bicepts tendon. Likely  tendonitis. No concern for cardiopulmonary etiology. Xray reassuring. Will treat with Nsaids, refer to ortho/sports med and f/u with pmd. Advised on return precautions.    [KM]    Clinical Course User Index [KM] Kristine Royal   MDM Rules/Calculators/A&P  Based on review of vitals, medical screening exam, lab work and/or imaging, there does not appear to be an acute, emergent etiology for the patient's symptoms. Counseled pt on good return precautions and encouraged both PCP and ED follow-up as needed.  Prior to discharge, I also discussed incidental imaging findings with patient in detail and advised appropriate, recommended follow-up in detail.  Clinical Impression: 1. Strain of left shoulder, initial encounter     Disposition: Discharge  Prior to providing a prescription for a controlled substance, I independently reviewed the patient's recent prescription history on the [place]. The patient had no recent or regular prescriptions and was deemed appropriate for a brief, less than 3 day prescription of narcotic for acute analgesia.  This note was prepared with assistance of Systems analyst. Occasional wrong-word or sound-a-like substitutions may have occurred due to the inherent limitations of voice recognition software.  Final Clinical Impression(s) / ED Diagnoses Final diagnoses:  Strain of left shoulder, initial encounter    Rx / DC Orders ED Discharge Orders         Ordered    naproxen (NAPROSYN) 500 MG tablet  2 times daily     10/30/19 1648           Kristine Royal 10/30/19 1649    Margette Fast, MD 10/31/19 1241

## 2019-10-30 NOTE — ED Triage Notes (Signed)
Pt c/o chest pain that worsens with positional changes x 2 days, pt reports increased pain when lifting/uses arm, denies any additional symptoms

## 2019-11-07 ENCOUNTER — Other Ambulatory Visit: Payer: Self-pay

## 2019-11-07 ENCOUNTER — Encounter (HOSPITAL_COMMUNITY): Payer: Self-pay

## 2019-11-07 ENCOUNTER — Emergency Department (HOSPITAL_COMMUNITY)
Admission: EM | Admit: 2019-11-07 | Discharge: 2019-11-07 | Disposition: A | Discharge status: Home / Self Care | Payer: Self-pay | Attending: Emergency Medicine | Admitting: Emergency Medicine

## 2019-11-07 DIAGNOSIS — M62838 Other muscle spasm: Secondary | ICD-10-CM

## 2019-11-07 DIAGNOSIS — M542 Cervicalgia: Secondary | ICD-10-CM | POA: Insufficient documentation

## 2019-11-07 DIAGNOSIS — S46912D Strain of unspecified muscle, fascia and tendon at shoulder and upper arm level, left arm, subsequent encounter: Secondary | ICD-10-CM

## 2019-11-07 DIAGNOSIS — X58XXXD Exposure to other specified factors, subsequent encounter: Secondary | ICD-10-CM | POA: Insufficient documentation

## 2019-11-07 MED ORDER — CYCLOBENZAPRINE HCL 10 MG PO TABS: 10.0000 mg | ORAL_TABLET | Freq: Two times a day (BID) | ORAL | 0 refills | Status: DC | PRN

## 2019-11-07 MED ORDER — NAPROXEN 500 MG PO TABS: 500.0000 mg | ORAL_TABLET | Freq: Two times a day (BID) | ORAL | 0 refills | Status: DC

## 2019-11-07 NOTE — ED Triage Notes (Signed)
Pt presents to ED with complaints of neck pain on left side. Pt states pain shoots down from ear to left side of abd. Pt states she was seen for left shoulder pain last week and now it is in her neck.

## 2019-11-07 NOTE — ED Provider Notes (Signed)
[facility] EMERGENCY DEPARTMENT Provider Note   CSN: [id number] Arrival date & time: 11/07/19  1505     History Chief Complaint  Patient presents with  . Neck Pain    Elizabeth Wilson is a 19 y.o. female.  19 year old female presents with complaint of pain in her left shoulder as well as left neck.  Patient states pain started her shoulder about a week ago, was seen in this emergency room and given a prescription for naproxen however she has not been able to pick that up.  Patient now has pain in her left trapezius area that is worse with movement.  Denies falls or injuries to the area.  Pain is worse with movement.  No other complaints or concerns.        Past Medical History:  Diagnosis Date  . Atrial myxoma   . Atypical chest pain   . Depression   . 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 Seizures [facility])     Patient Active Problem List   Diagnosis Date Noted  . Depression 03/08/2017  . Yeast infection 03/08/2017  . Vaginal itching 03/08/2017    Past Surgical History:  Procedure Laterality Date  . No surgical history       OB History    Gravida  0   Para  0   Term  0   Preterm  0   AB  0   Living  0     SAB  0   TAB  0   Ectopic  0   Multiple  0   Live Births  0           Family History  Problem Relation Age of Onset  . Cancer Paternal Grandfather   . Lung cancer Paternal Grandmother   . Congestive Heart Failure Maternal Grandmother   . Heart attack Maternal Grandmother   . Other Maternal Grandmother        Tumor; shrinking blood vessels to brain  . Hypertension Mother   . Anxiety disorder Mother   . Asthma Brother     Social History   Tobacco Use  . Smoking status: Never Smoker  . Smokeless tobacco: Never Used  Substance Use Topics  . Alcohol use: No  . Drug use: No    Home Medications Prior to Admission medications   Medication Sig Start Date End Date Taking? Authorizing Provider  naproxen sodium (ALEVE) 220 MG tablet Take 220  mg by mouth daily as needed (for pain).   Yes [provider]  cyclobenzaprine (FLEXERIL) 10 MG tablet Take 1 tablet (10 mg total) by mouth 2 (two) times daily as needed for muscle spasms. 11/07/19   Tacy Learn, PA-C  naproxen (NAPROSYN) 500 MG tablet Take 1 tablet (500 mg total) by mouth 2 (two) times daily. 11/07/19   Tacy Learn, PA-C  omeprazole (PRILOSEC) 20 MG capsule Take 1 capsule (20 mg total) by mouth 2 (two) times daily before a meal. Patient not taking: Reported on 11/07/2019 07/16/19   Varney Biles, MD  ondansetron (ZOFRAN ODT) 4 MG disintegrating tablet Take 1 tablet (4 mg total) by mouth every 8 (eight) hours as needed for nausea or vomiting. Patient not taking: Reported on 11/07/2019 07/16/19   Varney Biles, MD  sucralfate (CARAFATE) 1 GM/10ML suspension Take 10 mLs (1 g total) by mouth 4 (four) times daily -  with meals and at bedtime. Patient not taking: Reported on 11/07/2019 07/16/19   Varney Biles, MD  Allergies    Patient has no known allergies.  Review of Systems   Review of Systems  Constitutional: Negative for fever.  Musculoskeletal: Positive for arthralgias, myalgias and neck pain.  Skin: Negative for rash and wound.  Allergic/Immunologic: Negative for immunocompromised state.  Neurological: Negative for weakness and numbness.    Physical Exam Updated Vital Signs BP 115/70 (BP Location: Right Arm)   Pulse 86   Temp 98 F (36.7 C) (Oral)   Resp 17   Ht 5\' 3"  (1.6 m)   Wt 49.9 kg   SpO2 99%   BMI 19.49 kg/m   Physical Exam Vitals and nursing note reviewed.  Constitutional:      General: She is not in acute distress.    Appearance: She is well-developed. She is not diaphoretic.  HENT:     Head: Normocephalic and atraumatic.  Pulmonary:     Effort: Pulmonary effort is normal.  Musculoskeletal:        General: Tenderness present.       Arms:     Comments: TTP left trapezius muscle extending along medial border of the scapula.  TTP left SCM as well as left proximal biceps tendon.   Skin:    General: Skin is warm and dry.     Findings: No erythema or rash.  Neurological:     Mental Status: She is alert and oriented to person, place, and time.  Psychiatric:        Behavior: Behavior normal.     ED Results / Procedures / Treatments   Labs (all labs ordered are listed, but only abnormal results are displayed) Labs Reviewed - No data to display  EKG None  Radiology No results found.  Procedures Procedures (including critical care time)  Medications Ordered in ED Medications - No data to display  ED Course  I have reviewed the triage vital signs and the nursing notes.  Pertinent labs & imaging results that were available during my care of the patient were reviewed by me and considered in my medical decision making (see chart for details).  Clinical Course as of Nov 07 1707  Wed Nov 07, 2019  1708 18yo female with left shoulder pain. Exam consistent with muscle spasm/strain. XR from previous visit of left shoulder reviewed, negative, no injuries since that visit. Recommend muscle relaxor and NSAID, warm compresses and gentle stretching. Recommend follow up with PCP, may need PT if not improving with above treatment.    [LM]    Clinical Course User Index [LM] Roque Lias   MDM Rules/Calculators/A&P                      Final Clinical Impression(s) / ED Diagnoses Final diagnoses:  Trapezius muscle spasm  Strain of left shoulder, subsequent encounter    Rx / DC Orders ED Discharge Orders         Ordered    naproxen (NAPROSYN) 500 MG tablet  2 times daily     11/07/19 1634    cyclobenzaprine (FLEXERIL) 10 MG tablet  2 times daily PRN     11/07/19 1634           Tacy Learn, PA-C 11/07/19 1709    Milton Ferguson, MD 11/08/19 1053

## 2019-11-07 NOTE — Discharge Instructions (Addendum)
Take Naproxen and Flexeril as prescribed. Warm compresses for 20 minutes three times daily. Gentle range of motion exercises as discussed. Follow up with your doctor.

## 2020-12-14 ENCOUNTER — Emergency Department (HOSPITAL_COMMUNITY)
Admission: EM | Admit: 2020-12-14 | Discharge: 2020-12-14 | Disposition: A | Discharge status: Home / Self Care | Payer: Self-pay | Attending: Emergency Medicine | Admitting: Emergency Medicine

## 2020-12-14 ENCOUNTER — Other Ambulatory Visit: Payer: Self-pay

## 2020-12-14 DIAGNOSIS — T2662XA Corrosion of cornea and conjunctival sac, left eye, initial encounter: Secondary | ICD-10-CM | POA: Insufficient documentation

## 2020-12-14 DIAGNOSIS — T65891A Toxic effect of other specified substances, accidental (unintentional), initial encounter: Secondary | ICD-10-CM | POA: Insufficient documentation

## 2020-12-14 DIAGNOSIS — Y929 Unspecified place or not applicable: Secondary | ICD-10-CM | POA: Insufficient documentation

## 2020-12-14 DIAGNOSIS — T543X1A Toxic effect of corrosive alkalis and alkali-like substances, accidental (unintentional), initial encounter: Secondary | ICD-10-CM

## 2020-12-14 DIAGNOSIS — Y990 Civilian activity done for income or pay: Secondary | ICD-10-CM | POA: Insufficient documentation

## 2020-12-14 DIAGNOSIS — H538 Other visual disturbances: Secondary | ICD-10-CM | POA: Insufficient documentation

## 2020-12-14 DIAGNOSIS — Y93E5 Activity, floor mopping and cleaning: Secondary | ICD-10-CM | POA: Insufficient documentation

## 2020-12-14 DIAGNOSIS — X58XXXA Exposure to other specified factors, initial encounter: Secondary | ICD-10-CM | POA: Insufficient documentation

## 2020-12-14 MED ORDER — FLUORESCEIN SODIUM 1 MG OP STRP
1.0000 | ORAL_STRIP | Freq: Once | OPHTHALMIC | Status: AC
  Administered 2020-12-14: 1 via OPHTHALMIC
  Filled 2020-12-14: qty 1

## 2020-12-14 NOTE — Discharge Instructions (Addendum)
Your eye exam was reassuring, I would like you to purchase over-the-counter eye lubrication, you can find this at your local pharmacy.  Please apply this to your eyes a few time a day to help keep them moisturized and decrease irritation.  If you continue to have eye irritation, blurriness in your vision, worsening redness around the eye I like you to follow-up with ophthalmology for further evaluation.  Given the contact different above please call the follow-up.  Come back to the emergency department if you develop chest pain, shortness of breath, severe abdominal pain, uncontrolled nausea, vomiting, diarrhea.

## 2020-12-14 NOTE — ED Provider Notes (Signed)
[facility] EMERGENCY DEPARTMENT Provider Note   CSN: [id number] Arrival date & time: 12/14/20  2024     History Chief Complaint  Patient presents with   Eye Problem    Bleach contact     Elizabeth Wilson is a 20 y.o. female.  HPI  Patient with no significant medical history presents to the emergency department chief with complaint of bleach in her eyes.  Patient states she was at work today and was cleaning and some chloride splashed into her left eye.  Patient states she had a burning-like sensation in her eye, and endorses blurry vision.  She states she tried flushing her eye without relief.  She states this happened proximately 3 hours ago, she denies eye swelling, difficulty with eye movement, drainage or discharge,  has never had this happen to her in the past.  She denies any alleviating factors.  Patient does not endorse fevers, chills, chest pain or shortness of breath.  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[place])    Seizures ([place])     Patient Active Problem List   Diagnosis Date Noted   Depression 03/08/2017   Yeast infection 03/08/2017   Vaginal itching 03/08/2017    Past Surgical History:  Procedure Laterality Date   No surgical history       OB History     Gravida  0   Para  0   Term  0   Preterm  0   AB  0   Living  0      SAB  0   IAB  0   Ectopic  0   Multiple  0   Live Births  0           Family History  Problem Relation Age of Onset   Cancer Paternal Grandfather    Lung cancer Paternal Grandmother    Congestive Heart Failure Maternal Grandmother    Heart attack Maternal Grandmother    Other Maternal Grandmother        Tumor; shrinking blood vessels to brain   Hypertension Mother    Anxiety disorder Mother    Asthma Brother     Social History   Tobacco Use   Smoking status: Never   Smokeless tobacco: Never  Vaping Use   Vaping Use:  Never used  Substance Use Topics   Alcohol use: No   Drug use: No    Home Medications Prior to Admission medications   Medication Sig Start Date End Date Taking? Authorizing Provider  cyclobenzaprine (FLEXERIL) 10 MG tablet Take 1 tablet (10 mg total) by mouth 2 (two) times daily as needed for muscle spasms. 11/07/19   Tacy Learn, PA-C  naproxen (NAPROSYN) 500 MG tablet Take 1 tablet (500 mg total) by mouth 2 (two) times daily. 11/07/19   Tacy Learn, PA-C  naproxen sodium (ALEVE) 220 MG tablet Take 220 mg by mouth daily as needed (for pain).    [provider]  omeprazole (PRILOSEC) 20 MG capsule Take 1 capsule (20 mg total) by mouth 2 (two) times daily before a meal. Patient not taking: Reported on 11/07/2019 07/16/19   Varney Biles, MD  ondansetron (ZOFRAN ODT) 4 MG disintegrating tablet Take 1 tablet (4 mg total) by mouth every 8 (eight) hours as needed for nausea or vomiting. Patient not taking: Reported on 11/07/2019 07/16/19   Varney Biles,  MD  sucralfate (CARAFATE) 1 GM/10ML suspension Take 10 mLs (1 g total) by mouth 4 (four) times daily -  with meals and at bedtime. Patient not taking: Reported on 11/07/2019 07/16/19   Varney Biles, MD    Allergies    Patient has no known allergies.  Review of Systems   Review of Systems  Constitutional:  Negative for chills and fever.  HENT:  Negative for congestion.   Eyes:  Positive for pain and visual disturbance. Negative for photophobia, discharge, redness and itching.  Respiratory:  Negative for shortness of breath.   Cardiovascular:  Negative for chest pain.  Gastrointestinal:  Negative for abdominal pain.  Genitourinary:  Negative for enuresis.  Skin:  Negative for rash.  Neurological:  Negative for headaches.   Physical Exam Updated Vital Signs BP 121/68 (BP Location: Right Arm)   Pulse 68   Temp 98.2 F (36.8 C) (Oral)   Resp 17   Ht 5\' 2"  (1.575 m)   Wt 45.4 kg   SpO2 100%   BMI 18.29 kg/m    Physical Exam Vitals and nursing note reviewed.  Constitutional:      General: She is not in acute distress.    Appearance: Normal appearance. She is not ill-appearing or diaphoretic.  HENT:     Head: Normocephalic and atraumatic.     Nose: No congestion or rhinorrhea.  Eyes:     General:        Right eye: No discharge.        Left eye: No discharge.     Extraocular Movements: Extraocular movements intact.     Conjunctiva/sclera: Conjunctivae normal.     Pupils: Pupils are equal, round, and reactive to light.     Comments: Left eye was visualized there is no noted drainage or discharge, there is no sclera injection, EOMs were intact, PERRLA.  No surrounding erythema or edema around the periorbital region.  Cardiovascular:     Rate and Rhythm: Normal rate and regular rhythm.  Pulmonary:     Effort: Pulmonary effort is normal.  Musculoskeletal:     Cervical back: Neck supple.  Skin:    General: Skin is warm and dry.  Neurological:     Mental Status: She is alert.  Psychiatric:        Mood and Affect: Mood normal.    ED Results / Procedures / Treatments   Labs (all labs ordered are listed, but only abnormal results are displayed) Labs Reviewed - No data to display  EKG None  Radiology No results found.  Procedures Procedures   Medications Ordered in ED Medications  fluorescein ophthalmic strip 1 strip (1 strip Both Eyes Given 12/14/20 2247)    ED Course  I have reviewed the triage vital signs and the nursing notes.  Pertinent labs & imaging results that were available during my care of the patient were reviewed by me and considered in my medical decision making (see chart for details).    MDM Rules/Calculators/A&P                         Initial impression-patient presents with chemical irritant to her left eye.  She is alert, does not appear in acute stress, vital signs reassuring.  Will obtain visual acuity, flush out eyes, obtain pH, fluorescein eye for  further evaluation.  Work-up-  Visual Acuity  Right Eye Distance: 20/20 Left Eye Distance: 20/15 pH roughly 7 No fluorescein stain uptake  Consult-spoke with  Dr. Coralyn Pear of ophthalmology he recommends artificial tears, defer on antibiotic treatment, patient can follow-up as an outpatient if symptoms not improved.  Rule out- I have low suspicion for orbital cellulitis or periseptal cellulitis as patient had no pain with EOMs, there is no induration, fluctuance noted on exam around eyes.  Low suspicion for keratitis, eyes were visualized there is no visible dendritic lesion noted, there is no severe amount of purulent discharge noted.  Low suspicion for hyphema as patient denies recent trauma to the area no blood noted in the anterior chamber.  Low suspicion for eye ulcer as there is now stain uptake.  Plan-  Eye irritant since resolved-we will place patient on artificial tears, follow-up with ophthalmology as needed.  Vital signs have remained stable, no indication for hospital admission.  Patient discussed with attending and they agreed with assessment and plan.  Patient given at home care as well strict return precautions.  Patient verbalized that they understood agreed to said plan.  Final Clinical Impression(s) / ED Diagnoses Final diagnoses:  Alkaline chemical burn of left conjunctiva, initial encounter    Rx / DC Orders ED Discharge Orders     None        Aron Baba 12/14/20 2330    Hayden Rasmussen, MD 12/15/20 1049

## 2020-12-14 NOTE — ED Triage Notes (Signed)
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 Stating she flushed her eye several times however itching, pain, and blurry vision remain.

## 2021-07-19 ENCOUNTER — Other Ambulatory Visit: Payer: Self-pay

## 2021-07-19 ENCOUNTER — Emergency Department (HOSPITAL_COMMUNITY): Payer: Self-pay

## 2021-07-19 ENCOUNTER — Encounter (HOSPITAL_COMMUNITY): Payer: Self-pay | Admitting: *Deleted

## 2021-07-19 ENCOUNTER — Emergency Department (HOSPITAL_COMMUNITY)
Admission: EM | Admit: 2021-07-19 | Discharge: 2021-07-19 | Disposition: A | Discharge status: Home / Self Care | Payer: Self-pay | Attending: Emergency Medicine | Admitting: Emergency Medicine

## 2021-07-19 DIAGNOSIS — R0789 Other chest pain: Secondary | ICD-10-CM | POA: Insufficient documentation

## 2021-07-19 DIAGNOSIS — R61 Generalized hyperhidrosis: Secondary | ICD-10-CM | POA: Insufficient documentation

## 2021-07-19 LAB — PREGNANCY, URINE: Preg Test, Ur: NEGATIVE

## 2021-07-19 LAB — BASIC METABOLIC PANEL
Anion gap: 8 (ref 5–15)
BUN: 15 mg/dL (ref 6–20)
CO2: 24 mmol/L (ref 22–32)
Calcium: 9.2 mg/dL (ref 8.9–10.3)
Chloride: 104 mmol/L (ref 98–111)
Creatinine, Ser: 0.61 mg/dL (ref 0.44–1.00)
GFR, Estimated: >60 mL/min (ref >60)
Glucose, Bld: 76 mg/dL (ref 70–99)
Potassium: 3.9 mmol/L (ref 3.5–5.1)
Sodium: 136 mmol/L (ref 135–145)

## 2021-07-19 LAB — CBC WITH DIFFERENTIAL/PLATELET
Abs Immature Granulocytes: 0.01 10*3/uL (ref 0.00–0.07)
Basophils Absolute: 0 10*3/uL (ref 0.0–0.1)
Basophils Relative: 0 %
Eosinophils Absolute: 0 10*3/uL (ref 0.0–0.5)
Eosinophils Relative: 0 %
HCT: 33 % — ABNORMAL LOW (ref 36.0–46.0)
Hemoglobin: 10.8 g/dL — ABNORMAL LOW (ref 12.0–15.0)
Immature Granulocytes: 0 %
Lymphocytes Relative: 42 %
Lymphs Abs: 1.7 10*3/uL (ref 0.7–4.0)
MCH: 31.4 pg (ref 26.0–34.0)
MCHC: 32.7 g/dL (ref 30.0–36.0)
MCV: 95.9 fL (ref 80.0–100.0)
Monocytes Absolute: 0.4 10*3/uL (ref 0.1–1.0)
Monocytes Relative: 10 %
Neutro Abs: 2 10*3/uL (ref 1.7–7.7)
Neutrophils Relative %: 48 %
Platelets: 256 10*3/uL (ref 150–400)
RBC: 3.44 MIL/uL — ABNORMAL LOW (ref 3.87–5.11)
RDW: 13 % (ref 11.5–15.5)
WBC: 4.2 10*3/uL (ref 4.0–10.5)
nRBC: 0 % (ref 0.0–0.2)

## 2021-07-19 LAB — TROPONIN I (HIGH SENSITIVITY): Troponin I (High Sensitivity): <2 ng/L (ref <18)

## 2021-07-19 MED ORDER — OMEPRAZOLE 20 MG PO CPDR: 20.0000 mg | DELAYED_RELEASE_CAPSULE | Freq: Every day | ORAL | 0 refills | Status: DC

## 2021-07-19 MED ORDER — FAMOTIDINE 20 MG PO TABS
20.0000 mg | ORAL_TABLET | Freq: Once | ORAL | Status: AC
  Administered 2021-07-19: 20 mg via ORAL
  Filled 2021-07-19: qty 1

## 2021-07-19 MED ORDER — ALUM & MAG HYDROXIDE-SIMETH 200-200-20 MG/5ML PO SUSP
30.0000 mL | Freq: Once | ORAL | Status: AC
  Administered 2021-07-19: 30 mL via ORAL
  Filled 2021-07-19: qty 30

## 2021-07-19 NOTE — ED Provider Notes (Signed)
[facility] Provider Note   CSN: [id number] Arrival date & time: 07/19/21  1736     History  Chief Complaint  Patient presents with   Chest Pain    Elizabeth Wilson is a 21 y.o. female.  HPI  Patient with no significant medical history presents with complaints of chest pain, started 2 days ago, came on suddenly, describes as a pressure-like sensation middle of her chest, does not radiate, not a tearing or stabbing sensation does not radiate into her back, had associated diaphoresis and slight pleuritic chest pain, denies shortness of breath, nausea vomiting, patient states pain is worsened when she stands up, worse after p.o. intake, denies orthopnea or worsening peripheral edema.  No cardiac history denies illicit drug use no tobacco use, no history of PEs or DVTs currently on hormone therapy.  Patient only complaints.  Home Medications Prior to Admission medications   Medication Sig Start Date End Date Taking? Authorizing Provider  omeprazole (PRILOSEC) 20 MG capsule Take 1 capsule (20 mg total) by mouth daily. 07/19/21 08/18/21 Yes Marcello Fennel, PA-C  cyclobenzaprine (FLEXERIL) 10 MG tablet Take 1 tablet (10 mg total) by mouth 2 (two) times daily as needed for muscle spasms. 11/07/19   Tacy Learn, PA-C  naproxen (NAPROSYN) 500 MG tablet Take 1 tablet (500 mg total) by mouth 2 (two) times daily. 11/07/19   Tacy Learn, PA-C  naproxen sodium (ALEVE) 220 MG tablet Take 220 mg by mouth daily as needed (for pain).    [provider]  ondansetron (ZOFRAN ODT) 4 MG disintegrating tablet Take 1 tablet (4 mg total) by mouth every 8 (eight) hours as needed for nausea or vomiting. Patient not taking: Reported on 11/07/2019 07/16/19   Varney Biles, MD  sucralfate (CARAFATE) 1 GM/10ML suspension Take 10 mLs (1 g total) by mouth 4 (four) times daily -  with meals and at bedtime. Patient not taking: Reported on 11/07/2019 07/16/19   Varney Biles, MD       Allergies    Patient has no known allergies.    Review of Systems   Review of Systems  Constitutional:  Negative for chills and fever.  Respiratory:  Negative for shortness of breath.   Cardiovascular:  Positive for chest pain. Negative for palpitations and leg swelling.  Gastrointestinal:  Negative for abdominal pain, diarrhea, nausea and vomiting.  Neurological:  Negative for headaches.   Physical Exam Updated Vital Signs BP 102/67    Pulse 84    Temp 98.8 F (37.1 C) (Oral)    Resp 14    Ht 5\' 3"  (1.6 m)    Wt 39 kg    SpO2 100%    BMI 15.23 kg/m  Physical Exam Vitals and nursing note reviewed.  Constitutional:      General: She is not in acute distress.    Appearance: She is not ill-appearing.  HENT:     Head: Normocephalic and atraumatic.     Nose: No congestion.  Eyes:     Conjunctiva/sclera: Conjunctivae normal.  Cardiovascular:     Rate and Rhythm: Normal rate and regular rhythm.     Pulses: Normal pulses.     Heart sounds: No murmur heard.   No friction rub. No gallop.  Pulmonary:     Effort: No respiratory distress.     Breath sounds: No wheezing, rhonchi or rales.     Comments: Tenderness on her sternum chest pain is reproducible Chest:     Chest  wall: Tenderness present.  Abdominal:     Tenderness: There is abdominal tenderness. There is no right CVA tenderness or left CVA tenderness.     Comments: Abdomen nondistended slight epigastric tenderness, no guarding, rebound times, peritoneal sign negative Murphy sign or McBurney point.  Musculoskeletal:     Right lower leg: No edema.     Left lower leg: No edema.  Skin:    General: Skin is warm and dry.  Neurological:     Mental Status: She is alert.  Psychiatric:        Mood and Affect: Mood normal.    ED Results / Procedures / Treatments   Labs (all labs ordered are listed, but only abnormal results are displayed) Labs Reviewed  CBC WITH DIFFERENTIAL/PLATELET - Abnormal; Notable for the following  components:      Result Value   RBC 3.44 (*)    Hemoglobin 10.8 (*)    HCT 33.0 (*)    All other components within normal limits  BASIC METABOLIC PANEL  PREGNANCY, URINE  TROPONIN I (HIGH SENSITIVITY)    EKG EKG Interpretation  Date/Time:  Sunday July 19 2021 17:51:55 EST Ventricular Rate:  60 PR Interval:  150 QRS Duration: 91 QT Interval:  374 QTC Calculation: 374 R Axis:   56 Text Interpretation: Sinus rhythm Ventricular premature complex Low voltage, precordial leads Confirmed by Noemi Chapel [id number]) on 07/19/2021 6:40:39 PM  Radiology DG Chest 2 View  Result Date: 07/19/2021 CLINICAL DATA:  Chest pain for 2 days.  Shortness of breath. EXAM: CHEST - 2 VIEW COMPARISON:  June 10, 2018 FINDINGS: The heart size and mediastinal contours are within normal limits. Both lungs are clear. The visualized skeletal structures are unremarkable. IMPRESSION: No active cardiopulmonary disease. Electronically Signed   By: Dorise Bullion III M.D.   On: 07/19/2021 18:57    Procedures Procedures    Medications Ordered in ED Medications  alum & mag hydroxide-simeth (MAALOX/MYLANTA) 200-200-20 MG/5ML suspension 30 mL (30 mLs Oral Given 07/19/21 1857)  famotidine (PEPCID) tablet 20 mg (20 mg Oral Given 07/19/21 1857)    ED Course/ Medical Decision Making/ A&P                           Medical Decision Making Amount and/or Complexity of Data Reviewed Labs: ordered. Radiology: ordered.  Risk OTC drugs.   This patient presents to the ED for concern of chest pain, this involves an extensive number of treatment options, and is a complaint that carries with it a high risk of complications and morbidity.  The differential diagnosis includes ACS, PE, pneumonia    Additional history obtained:  Additional history obtained from N/A   Co morbidities that complicate the patient evaluation  N/A  Social Determinants of Health:  N/A    Lab Tests:  I Ordered, and personally  interpreted labs.  The pertinent results include: CBC shows hemoglobin of 10.8 at baseline for patient, BMP unremarkable for troponin is less than 2   Imaging Studies ordered:  I ordered imaging studies including chest x-ray I independently visualized and interpreted imaging which showed unremarkable I agree with the radiologist interpretation   Cardiac Monitoring:  The patient was maintained on a cardiac monitor.  I personally viewed and interpreted the cardiac monitored which showed an underlying rhythm of: EKG sinus rhythm without signs of ischemia   Medicines ordered and prescription drug management:  I ordered medication including GI cocktail, Pepcid for epigastric chest  pain I have reviewed the patients home medicines and have made adjustments as needed   Reevaluation:  Reassessed after GI cocktail and Pepcid, states she is feeling better, vitals remained stable, she is agreeable for discharge.  Rule out I have low suspicion for ACS as history is atypical, patient has no cardiac history, EKG was sinus rhythm without signs of ischemia, second troponin will be deferred as she has been having chest pain for greater than 12 hours, would expect elevation in first troponin if ACS/NSTEMI were present at this time.  Low suspicion for PE as patient denies pleuritic chest pain, shortness of breath, patient denies leg pain, no pedal edema noted on exam, patient was PERC negative.  Low suspicion for AAA or aortic dissection as history is atypical, patient has low risk factors.  Low suspicion for systemic infection as patient is nontoxic-appearing, vital signs reassuring, no obvious source infection noted on exam.     Dispostion and problem list  After consideration of the diagnostic results and the patients response to treatment, I feel that the patent would benefit from discharge.  Chest pains since improved-unclear etiology but likely GERD as it is positional worse with p.o. intake,  improved with GI cocktail we will start her on a PPI follow-up with PCP for further evaluation.            Final Clinical Impression(s) / ED Diagnoses Final diagnoses:  Atypical chest pain    Rx / DC Orders ED Discharge Orders          Ordered    omeprazole (PRILOSEC) 20 MG capsule  Daily        07/19/21 1945              Aron Baba 07/19/21 1946    Noemi Chapel, MD 07/19/21 2258

## 2021-07-19 NOTE — Discharge Instructions (Signed)
Lab work imaging reassuring start you on an acid pill take as prescribed  Please call your PCP for further evaluation   Come back to the emergency department if you develop chest pain, shortness of breath, severe abdominal pain, uncontrolled nausea, vomiting, diarrhea.

## 2021-07-19 NOTE — ED Triage Notes (Signed)
C/o chest pain with shortness of breath onset yesterday

## 2021-07-19 NOTE — ED Notes (Signed)
Patient stated while working today, she started having constant chest pain. Pain started 2 days ago but gradually getting worse.Radiation to left neck and arm with slight shortness of breath. Pain continues to remain constant. Patient stated when she sits down the pain is relieved. Pain Scale 7/10.

## 2021-07-19 NOTE — ED Notes (Signed)
ED Provider at bedside. 

## 2022-11-17 ENCOUNTER — Emergency Department (HOSPITAL_COMMUNITY): Payer: Self-pay

## 2022-11-17 ENCOUNTER — Encounter (HOSPITAL_COMMUNITY): Payer: Self-pay | Admitting: *Deleted

## 2022-11-17 ENCOUNTER — Other Ambulatory Visit: Payer: Self-pay

## 2022-11-17 ENCOUNTER — Emergency Department (HOSPITAL_COMMUNITY)
Admission: EM | Admit: 2022-11-17 | Discharge: 2022-11-17 | Disposition: A | Discharge status: Home / Self Care | Payer: Self-pay | Attending: Emergency Medicine | Admitting: Emergency Medicine

## 2022-11-17 DIAGNOSIS — W11XXXA Fall on and from ladder, initial encounter: Secondary | ICD-10-CM | POA: Insufficient documentation

## 2022-11-17 DIAGNOSIS — S40011A Contusion of right shoulder, initial encounter: Secondary | ICD-10-CM | POA: Insufficient documentation

## 2022-11-17 DIAGNOSIS — S161XXA Strain of muscle, fascia and tendon at neck level, initial encounter: Secondary | ICD-10-CM | POA: Insufficient documentation

## 2022-11-17 DIAGNOSIS — S5001XA Contusion of right elbow, initial encounter: Secondary | ICD-10-CM | POA: Insufficient documentation

## 2022-11-17 MED ORDER — IBUPROFEN 800 MG PO TABS
800.0000 mg | ORAL_TABLET | Freq: Once | ORAL | Status: AC
  Administered 2022-11-17: 800 mg via ORAL
  Filled 2022-11-17: qty 1

## 2022-11-17 MED ORDER — NAPROXEN 500 MG PO TABS: 500.0000 mg | ORAL_TABLET | Freq: Two times a day (BID) | ORAL | 0 refills | Status: DC

## 2022-11-17 NOTE — ED Provider Notes (Signed)
[place] EMERGENCY DEPARTMENT AT [facility] Provider Note   CSN: [id number] Arrival date & time: 11/17/22  0820     History  Chief Complaint  Patient presents with   Elizabeth Wilson is a 22 y.o. female.  HPI   This patient is a 22 year old female, she is otherwise healthy, she states that yesterday she was on a ladder and fell off the ladder landing on her right side injuring her right shoulder and elbow, she was having a small amount of pain yesterday but that seem to get worse today when she woke up to go to work early this morning.  She denies loss of consciousness, denies shortness of breath or having any pain with breathing.  She does have pain with moving the right shoulder and elbow.  Home Medications Prior to Admission medications   Medication Sig Start Date End Date Taking? Authorizing Provider  naproxen (NAPROSYN) 500 MG tablet Take 1 tablet (500 mg total) by mouth 2 (two) times daily with a meal. 11/17/22  Yes Eber Hong, MD  omeprazole (PRILOSEC) 20 MG capsule Take 1 capsule (20 mg total) by mouth daily. 07/19/21 08/18/21  Carroll Sage, PA-C      Allergies    Patient has no known allergies.    Review of Systems   Review of Systems  Musculoskeletal:  Positive for arthralgias and neck pain. Negative for joint swelling.  Skin:  Negative for rash and wound.    Physical Exam Updated Vital Signs BP 108/74 (BP Location: Left Arm)   Pulse (!) 53   Temp 98.1 F (36.7 C) (Oral)   Resp 16   Ht 1.575 m (5\' 2" )   Wt 44.5 kg   SpO2 100%   BMI 17.92 kg/m  Physical Exam Vitals and nursing note reviewed.  Constitutional:      Appearance: She is well-developed. She is not diaphoretic.  HENT:     Head: Normocephalic and atraumatic.  Eyes:     General:        Right eye: No discharge.        Left eye: No discharge.     Conjunctiva/sclera: Conjunctivae normal.  Pulmonary:     Effort: Pulmonary effort is normal. No respiratory distress.   Musculoskeletal:        General: Tenderness present. No swelling or deformity.     Right lower leg: No edema.     Left lower leg: No edema.     Comments: Right upper extremity with normal appearance, normal contour of the right shoulder and the right elbow, good range of motion of the right elbow but with some tenderness over the lateral epicondyle of the distal humerus.  Has pain with palpation around the right shoulder girdle.  Normal function of the biceps and triceps, normal grip  Mild tenderness over the cervical spine  Skin:    General: Skin is warm and dry.     Findings: No erythema or rash.  Neurological:     Mental Status: She is alert.     Coordination: Coordination normal.     Comments: Normal sensation and strength to the right upper extremity     ED Results / Procedures / Treatments   Labs (all labs ordered are listed, but only abnormal results are displayed) Labs Reviewed - No data to display  EKG None  Radiology DG Cervical Spine Complete  Result Date: 11/17/2022 CLINICAL DATA:  Trauma, fall off ladder. Neck pain. EXAM: CERVICAL SPINE -  COMPLETE 4+ VIEW COMPARISON:  None Available. FINDINGS: Cervical spine alignment is maintained. Vertebral body heights and intervertebral disc spaces are preserved. The dens is intact. Posterior elements appear well-aligned. There is no evidence of fracture. No prevertebral soft tissue edema. IMPRESSION: Negative radiographs of the cervical spine. Electronically Signed   By: Narda Rutherford M.D.   On: 11/17/2022 09:35   DG ELBOW COMPLETE RIGHT (3+VIEW)  Result Date: 11/17/2022 CLINICAL DATA:  Trauma, fall off ladder onto right side. Right elbow pain. EXAM: RIGHT ELBOW - COMPLETE 3+ VIEW COMPARISON:  None Available. FINDINGS: There is no evidence of fracture, dislocation, or joint effusion. Normal joint spaces and alignment. There is no evidence of arthropathy or other focal bone abnormality. Soft tissues are unremarkable. IMPRESSION:  Negative radiographs of the right elbow. Electronically Signed   By: Narda Rutherford M.D.   On: 11/17/2022 09:34   DG Shoulder Right  Result Date: 11/17/2022 CLINICAL DATA:  Trauma. Fall off ladder yesterday landing on right side. EXAM: RIGHT SHOULDER - 2+ VIEW COMPARISON:  None Available. FINDINGS: There is no evidence of fracture or dislocation. There is no evidence of arthropathy or other focal bone abnormality. The included right ribs are intact. Soft tissues are unremarkable. IMPRESSION: Negative radiographs of the right shoulder. Electronically Signed   By: Narda Rutherford M.D.   On: 11/17/2022 09:30    Procedures Procedures    Medications Ordered in ED Medications  ibuprofen (ADVIL) tablet 800 mg (800 mg Oral Given 11/17/22 0836)    ED Course/ Medical Decision Making/ A&P                             Medical Decision Making Amount and/or Complexity of Data Reviewed Radiology: ordered.  Risk Prescription drug management.    This patient presents to the ED for concern of fall with injury to right shoulder right elbow and some neck pain differential diagnosis includes fractures, dislocations, muscle strain    Additional history obtained:  Additional history obtained from electronic medical record External records from outside source obtained and reviewed including multiple ED visits, no recent admissions to the hospital, echocardiogram reviewed from 2019 due to history of syncope, unremarkable   Imaging Studies ordered:  I ordered imaging studies including x-ray of the cervical spine right shoulder right elbow I independently visualized and interpreted imaging which showed no acute findings of fractures or dislocations I agree with the radiologist interpretation   Medicines ordered and prescription drug management:  I ordered medication including ibuprofen for inflammation  Reevaluation of the patient after these medicines showed that the patient stable I have  reviewed the patients home medicines and have made adjustments as needed   Problem List / ED Course:  Counseled on injury prevention   Social Determinants of Health:  None  I have discussed with the patient at the bedside the results, and the meaning of these results.  They have expressed her understanding to the need for follow-up with primary care physician          Final Clinical Impression(s) / ED Diagnoses Final diagnoses:  Contusion of right elbow, initial encounter  Contusion of right shoulder, initial encounter  Cervical strain, acute, initial encounter    Rx / DC Orders ED Discharge Orders          Ordered    naproxen (NAPROSYN) 500 MG tablet  2 times daily with meals        11/17/22 0943  Eber Hong, MD 11/17/22 [phone]

## 2022-11-17 NOTE — Discharge Instructions (Addendum)
Your x-rays were all normal, you may take an anti-inflammatory as below, I recommend using cold compresses to help reduce inflammation, you will probably get better over the next 7 days  Please take Naprosyn, 500mg  by mouth twice daily as needed for pain - this in an antiinflammatory medicine (NSAID) and is similar to ibuprofen - many people feel that it is stronger than ibuprofen and it is easier to take since it is a smaller pill.  Please use this only for 1 week - if your pain persists, you will need to follow up with your doctor in the office for ongoing guidance and pain control.  Thank you for allowing [place] to treat you in the emergency department today.  After reviewing your examination and potential testing that was done it appears that you are safe to go home.  I would like for you to follow-up with your doctor within the next several days, have them obtain your results and follow-up with them to review all of these tests.  If you should develop severe or worsening symptoms return to the emergency department immediately

## 2022-11-17 NOTE — ED Triage Notes (Signed)
Pt states he fell about 8 feet off a ladder yesterday trying to get a tree branch; pt states she fell landing on her right side; pt c/o pain to right elbow, between her shoulder blades and some neck pain  Pt denies any loc

## 2022-12-25 ENCOUNTER — Other Ambulatory Visit: Payer: Self-pay

## 2022-12-25 ENCOUNTER — Encounter (HOSPITAL_COMMUNITY): Payer: Self-pay

## 2022-12-25 ENCOUNTER — Emergency Department (HOSPITAL_COMMUNITY): Payer: Self-pay

## 2022-12-25 ENCOUNTER — Emergency Department (HOSPITAL_COMMUNITY)
Admission: EM | Admit: 2022-12-25 | Discharge: 2022-12-25 | Discharge status: Against Medical Advice | Payer: Self-pay | Attending: Emergency Medicine | Admitting: Emergency Medicine

## 2022-12-25 DIAGNOSIS — Z5321 Procedure and treatment not carried out due to patient leaving prior to being seen by health care provider: Secondary | ICD-10-CM | POA: Insufficient documentation

## 2022-12-25 DIAGNOSIS — R569 Unspecified convulsions: Secondary | ICD-10-CM | POA: Insufficient documentation

## 2022-12-25 DIAGNOSIS — R079 Chest pain, unspecified: Secondary | ICD-10-CM | POA: Insufficient documentation

## 2022-12-25 LAB — BASIC METABOLIC PANEL
Anion gap: 7 (ref 5–15)
BUN: 10 mg/dL (ref 6–20)
CO2: 24 mmol/L (ref 22–32)
Calcium: 9.1 mg/dL (ref 8.9–10.3)
Chloride: 104 mmol/L (ref 98–111)
Creatinine, Ser: 0.72 mg/dL (ref 0.44–1.00)
GFR, Estimated: >60 mL/min (ref >60)
Glucose, Bld: 83 mg/dL (ref 70–99)
Potassium: 3.1 mmol/L — ABNORMAL LOW (ref 3.5–5.1)
Sodium: 135 mmol/L (ref 135–145)

## 2022-12-25 LAB — CBC
HCT: 32 % — ABNORMAL LOW (ref 36.0–46.0)
Hemoglobin: 10.8 g/dL — ABNORMAL LOW (ref 12.0–15.0)
MCH: 32.2 pg (ref 26.0–34.0)
MCHC: 33.8 g/dL (ref 30.0–36.0)
MCV: 95.5 fL (ref 80.0–100.0)
Platelets: 279 10*3/uL (ref 150–400)
RBC: 3.35 MIL/uL — ABNORMAL LOW (ref 3.87–5.11)
RDW: 13.3 % (ref 11.5–15.5)
WBC: 6.8 10*3/uL (ref 4.0–10.5)
nRBC: 0 % (ref 0.0–0.2)

## 2022-12-25 LAB — TROPONIN I (HIGH SENSITIVITY): Troponin I (High Sensitivity): <2 ng/L (ref <18)

## 2022-12-25 NOTE — ED Triage Notes (Signed)
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

## 2023-03-22 ENCOUNTER — Emergency Department (HOSPITAL_COMMUNITY): Payer: Self-pay

## 2023-03-22 ENCOUNTER — Emergency Department (HOSPITAL_COMMUNITY)
Admission: EM | Admit: 2023-03-22 | Discharge: 2023-03-22 | Disposition: A | Discharge status: Home / Self Care | Payer: Self-pay | Attending: Emergency Medicine | Admitting: Emergency Medicine

## 2023-03-22 ENCOUNTER — Encounter (HOSPITAL_COMMUNITY): Payer: Self-pay | Admitting: *Deleted

## 2023-03-22 ENCOUNTER — Other Ambulatory Visit: Payer: Self-pay

## 2023-03-22 DIAGNOSIS — R072 Precordial pain: Secondary | ICD-10-CM | POA: Insufficient documentation

## 2023-03-22 DIAGNOSIS — R0602 Shortness of breath: Secondary | ICD-10-CM | POA: Insufficient documentation

## 2023-03-22 DIAGNOSIS — R079 Chest pain, unspecified: Secondary | ICD-10-CM

## 2023-03-22 DIAGNOSIS — Z8589 Personal history of malignant neoplasm of other organs and systems: Secondary | ICD-10-CM | POA: Insufficient documentation

## 2023-03-22 DIAGNOSIS — R000 Tachycardia, unspecified: Secondary | ICD-10-CM | POA: Insufficient documentation

## 2023-03-22 LAB — D-DIMER, QUANTITATIVE: D-Dimer, Quant: 0.5 ug{FEU}/mL (ref 0.00–0.50)

## 2023-03-22 LAB — BASIC METABOLIC PANEL
Anion gap: 8 (ref 5–15)
BUN: 9 mg/dL (ref 6–20)
CO2: 24 mmol/L (ref 22–32)
Calcium: 9.2 mg/dL (ref 8.9–10.3)
Chloride: 103 mmol/L (ref 98–111)
Creatinine, Ser: 0.69 mg/dL (ref 0.44–1.00)
GFR, Estimated: >60 mL/min (ref >60)
Glucose, Bld: 80 mg/dL (ref 70–99)
Potassium: 4 mmol/L (ref 3.5–5.1)
Sodium: 135 mmol/L (ref 135–145)

## 2023-03-22 LAB — POC URINE PREG, ED: Preg Test, Ur: NEGATIVE

## 2023-03-22 LAB — CBC
HCT: 35.8 % — ABNORMAL LOW (ref 36.0–46.0)
Hemoglobin: 12.2 g/dL (ref 12.0–15.0)
MCH: 32.6 pg (ref 26.0–34.0)
MCHC: 34.1 g/dL (ref 30.0–36.0)
MCV: 95.7 fL (ref 80.0–100.0)
Platelets: 314 10*3/uL (ref 150–400)
RBC: 3.74 MIL/uL — ABNORMAL LOW (ref 3.87–5.11)
RDW: 13.2 % (ref 11.5–15.5)
WBC: 9.8 10*3/uL (ref 4.0–10.5)
nRBC: 0 % (ref 0.0–0.2)

## 2023-03-22 LAB — TSH: TSH: 0.922 u[IU]/mL (ref 0.350–4.500)

## 2023-03-22 LAB — TROPONIN I (HIGH SENSITIVITY)
Troponin I (High Sensitivity): <2 ng/L (ref <18)
Troponin I (High Sensitivity): <2 ng/L (ref <18)

## 2023-03-22 MED ORDER — NAPROXEN 500 MG PO TABS: 500.0000 mg | ORAL_TABLET | Freq: Two times a day (BID) | ORAL | 0 refills | Status: DC | PRN

## 2023-03-22 NOTE — Discharge Instructions (Addendum)
Your lab tests, x-rays, EKG and exam are all reassuring today.  I do recommend obtaining a local primary MD as you are planning.  I have prescribed use a medication to help you with this pain which may be chest wall related.  Your heart appears healthy and we have ruled out a blood clot as a source of your symptoms.  You may also want to consider reevaluation by Dr. Diona Browner if your symptoms persist.

## 2023-03-22 NOTE — ED Notes (Signed)
Pt reminded that we need urine. 

## 2023-03-22 NOTE — ED Triage Notes (Signed)
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

## 2023-03-22 NOTE — ED Provider Notes (Signed)
[place] EMERGENCY DEPARTMENT AT [facility] Provider Note   CSN: [id number] Arrival date & time: 03/22/23  9937     History  Chief Complaint  Patient presents with   Chest Pain    Elizabeth Wilson is a 22 y.o. female presenting for evaluation of midsternal chest pain along with shortness of breath with exertion which started 2 days ago and reports having night sweats for the past week.  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  She denies cough, documented fevers, nausea or vomiting, abdominal pain, however she states she has had a poor appetite for the past week as well.  She reports an approximate 10 pound weight loss in the past several months.  She endorses palpitations, pain is worse with deep inspiration.  She denies peripheral edema, no pain in her extremities.  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  She is found no alleviators for symptoms.  The history is provided by the patient.       Home Medications Prior to Admission medications   Medication Sig Start Date End Date Taking? Authorizing Provider  acetaminophen (TYLENOL) 500 MG tablet Take 250 mg by mouth in the morning and at bedtime. 1/2 capsule BID prn   Yes [provider]  naproxen (NAPROSYN) 500 MG tablet Take 1 tablet (500 mg total) by mouth 2 (two) times daily as needed for moderate pain. 03/22/23  Yes IdolRaynelle Fanning, PA-C      Allergies    Patient has no known allergies.    Review of Systems   Review of Systems  Constitutional:  Positive for appetite change and unexpected weight change. Negative for fever.  HENT:  Negative for congestion.   Eyes: Negative.   Respiratory:  Positive for shortness of breath. Negative for chest tightness.   Cardiovascular:  Positive for chest pain and palpitations. Negative  for leg swelling.  Gastrointestinal:  Negative for abdominal pain, nausea and vomiting.  Genitourinary: Negative.   Musculoskeletal:  Negative for arthralgias, joint swelling and neck pain.  Skin: Negative.  Negative for rash and wound.  Neurological:  Negative for dizziness, weakness, light-headedness, numbness and headaches.  Psychiatric/Behavioral: Negative.      Physical Exam Updated Vital Signs BP 105/66   Pulse 65   Temp 98.5 F (36.9 C)   Resp 18   Ht 5\' 3"  (1.6 m)   Wt 40.8 kg   LMP 03/22/2023   SpO2 99%   BMI 15.94 kg/m  Physical Exam Vitals and nursing note reviewed.  Constitutional:      Appearance: She is well-developed.  HENT:     Head: Normocephalic and atraumatic.  Eyes:     Conjunctiva/sclera: Conjunctivae normal.  Cardiovascular:     Rate and Rhythm: Regular rhythm. Tachycardia present.     Heart sounds: Normal heart sounds.     Comments: During initial evaluation heart rate noted to fluctuate between 102 to 124. Pulmonary:     Effort: Pulmonary effort is normal.     Breath sounds: Normal breath sounds. No wheezing, rhonchi or rales.  Abdominal:     General: Bowel sounds are normal.     Palpations: Abdomen is soft.     Tenderness: There is no abdominal tenderness.  Musculoskeletal:        General: Normal range of  motion.     Cervical back: Normal range of motion.     Right lower leg: No tenderness. No edema.     Left lower leg: No tenderness. No edema.  Skin:    General: Skin is warm and dry.  Neurological:     Mental Status: She is alert.     ED Results / Procedures / Treatments   Labs (all labs ordered are listed, but only abnormal results are displayed) Labs Reviewed  CBC - Abnormal; Notable for the following components:      Result Value   RBC 3.74 (*)    HCT 35.8 (*)    All other components within normal limits  BASIC METABOLIC PANEL  TSH  D-DIMER, QUANTITATIVE  POC URINE PREG, ED  TROPONIN I (HIGH SENSITIVITY)  TROPONIN I (HIGH  SENSITIVITY)    EKG None ED ECG REPORT   Date: 03/23/2023  Rate: 66  Rhythm: normal sinus rhythm  QRS Axis: normal  Intervals: normal  ST/T Wave abnormalities: ST elevations laterally  Conduction Disutrbances:none  Narrative Interpretation: ST elevation V5, unchanged from 07/19/21  Old EKG Reviewed: unchanged  I have personally reviewed the EKG tracing and agree with the computerized printout as noted.  Radiology DG Chest 2 View  Result Date: 03/22/2023 CLINICAL DATA:  sob, chest pain EXAM: CHEST - 2 VIEW COMPARISON:  12/25/2022. FINDINGS: Bilateral lung fields are clear. Bilateral costophrenic angles are clear. Normal cardio-mediastinal silhouette. No acute osseous abnormalities. The soft tissues are within normal limits. IMPRESSION: No active cardiopulmonary disease. Electronically Signed   By: Jules Schick M.D.   On: 03/22/2023 11:53    Procedures Procedures    Medications Ordered in ED Medications - No data to display  ED Course/ Medical Decision Making/ A&P                                 Medical Decision Making Patient presenting with multiple symptoms which were included broad differential, night sweats but no documented fevers, suggesting possible infectious process such as pneumonia, poor appetite in the recent months with a 10 pound weight loss per patient report, palpitations and chest pain which is sometimes exertional but also sometimes triggered by physical activity such as lifting objects.  This could be a musculoskeletal chest wall concern, she has no risk factors for unstable angina but patient does have concerns about a prior diagnosis of a benign cardiac tumor.  Review of the chart indicates that this diagnosis was ruled out by cardiac MRI in 2019 which was discussed with the patient.  Given her multiple symptoms, this could also be a metabolic issue such as a thyroid concern, there TSH was also ordered, D-dimer to rule out PE.  Patient will be referred back  to her cardiologist for office follow-up  Amount and/or Complexity of Data Reviewed External Data Reviewed: radiology and notes.    Details: Review of cardiology evaluation from 2019 including a cardiac MRI ruling out atrial myxoma which was initially suggested on TEE Labs: ordered.    Details: Labs are reassuring, patient has a negative troponin, delta troponin not indicated as her symptoms have been present for greater than 24 hours.  She has a negative D-dimer at 0.5, she has had intermittent tachycardia while here, I have noticed a pattern where she tends to become tachycardic when I am in the room but not when monitored from outside the room suggesting possible anxiety component.  Reassuring  that her D-dimer is negative, doubt this represents PE.  Her be met is normal, CBC also reassuring with a normal hemoglobin of 12.2. Radiology: ordered.    Details: Chest x-ray reviewed and agree with interpretation, no pneumonia. ECG/medicine tests: ordered.    Details: Normal sinus rhythm with rate of 66  Risk Prescription drug management.           Final Clinical Impression(s) / ED Diagnoses Final diagnoses:  Nonspecific chest pain    Rx / DC Orders ED Discharge Orders          Ordered    naproxen (NAPROSYN) 500 MG tablet  2 times daily PRN        03/22/23 1421    Ambulatory referral to Cardiology       Comments: If you have not heard from the Cardiology office within the next 72 hours please call [phone].   03/22/23 1425              Burgess Amor, PA-C 03/23/23 1135    Royanne Foots, DO 03/29/23 [phone]

## 2023-09-05 ENCOUNTER — Other Ambulatory Visit: Payer: Self-pay

## 2023-09-05 ENCOUNTER — Emergency Department (HOSPITAL_COMMUNITY)
Admission: EM | Admit: 2023-09-05 | Discharge: 2023-09-06 | Disposition: A | Discharge status: Home / Self Care | Payer: Self-pay | Attending: Emergency Medicine | Admitting: Emergency Medicine

## 2023-09-05 ENCOUNTER — Encounter (HOSPITAL_COMMUNITY): Payer: Self-pay | Admitting: Emergency Medicine

## 2023-09-05 DIAGNOSIS — R0789 Other chest pain: Secondary | ICD-10-CM | POA: Insufficient documentation

## 2023-09-05 NOTE — ED Triage Notes (Signed)
 Pt c/o knot under left breast x one week with pain.

## 2023-09-06 ENCOUNTER — Emergency Department (HOSPITAL_COMMUNITY): Payer: Self-pay

## 2023-09-06 MED ORDER — IBUPROFEN 600 MG PO TABS: 600.0000 mg | ORAL_TABLET | Freq: Four times a day (QID) | ORAL | 0 refills | Status: AC | PRN

## 2023-09-06 NOTE — ED Provider Notes (Signed)
 [place] EMERGENCY DEPARTMENT AT [facility] Provider Note   CSN: [id number] Arrival date & time: 09/05/23  2333     History {Add pertinent medical, surgical, social history, OB history to HPI:1} Chief Complaint  Patient presents with   Breast Pain    Elizabeth Wilson is a 23 y.o. female.  Patient presents to the emergency department for evaluation of pain under the right breast.  She reports that she feels a knot in the area.  Symptoms have been present for 1 week.  Pain is worsening, takes her breath away sometimes.  Area is tender to the touch and it hurts when she moves her chest wall.       Home Medications Prior to Admission medications   Medication Sig Start Date End Date Taking? Authorizing Provider  ibuprofen (ADVIL) 600 MG tablet Take 1 tablet (600 mg total) by mouth every 6 (six) hours as needed. 09/06/23  Yes Montae Stager, Canary Brim, MD  acetaminophen (TYLENOL) 500 MG tablet Take 250 mg by mouth in the morning and at bedtime. 1/2 capsule BID prn    [provider]      Allergies    Patient has no known allergies.    Review of Systems   Review of Systems  Physical Exam Updated Vital Signs BP 97/80   Pulse 74   Temp 98.1 F (36.7 C) (Oral)   Resp 15   Ht 5\' 3"  (1.6 m)   Wt 45.4 kg   LMP 08/12/2023   SpO2 100%   BMI 17.71 kg/m  Physical Exam Vitals and nursing note reviewed. Exam conducted with a chaperone present.  Constitutional:      General: She is not in acute distress.    Appearance: She is well-developed.  HENT:     Head: Normocephalic and atraumatic.     Mouth/Throat:     Mouth: Mucous membranes are moist.  Eyes:     General: Vision grossly intact. Gaze aligned appropriately.     Extraocular Movements: Extraocular movements intact.     Conjunctiva/sclera: Conjunctivae normal.  Cardiovascular:     Rate and Rhythm: Normal rate and regular rhythm.     Pulses: Normal pulses.     Heart sounds: Normal heart sounds, S1  normal and S2 normal. No murmur heard.    No friction rub. No gallop.  Pulmonary:     Effort: Pulmonary effort is normal. No respiratory distress.     Breath sounds: Normal breath sounds.  Chest:     Chest wall: Tenderness present.       Comments: The "Knot" that patient is feeling is a palpable rib due to her very thin body habitus.  The area is tender to the touch.  No crepitance. Abdominal:     General: Bowel sounds are normal.     Palpations: Abdomen is soft.     Tenderness: There is no abdominal tenderness. There is no guarding or rebound.     Hernia: No hernia is present.  Musculoskeletal:        General: No swelling.     Cervical back: Full passive range of motion without pain, normal range of motion and neck supple. No spinous process tenderness or muscular tenderness. Normal range of motion.     Right lower leg: No edema.     Left lower leg: No edema.  Skin:    General: Skin is warm and dry.     Capillary Refill: Capillary refill takes less than 2 seconds.  Findings: No ecchymosis, erythema, rash or wound.  Neurological:     General: No focal deficit present.     Mental Status: She is alert and oriented to person, place, and time.     GCS: GCS eye subscore is 4. GCS verbal subscore is 5. GCS motor subscore is 6.     Cranial Nerves: Cranial nerves 2-12 are intact.     Sensory: Sensation is intact.     Motor: Motor function is intact.     Coordination: Coordination is intact.  Psychiatric:        Attention and Perception: Attention normal.        Mood and Affect: Mood normal.        Speech: Speech normal.        Behavior: Behavior normal.     ED Results / Procedures / Treatments   Labs (all labs ordered are listed, but only abnormal results are displayed) Labs Reviewed - No data to display  EKG None  Radiology No results found.  Procedures Procedures  {Document cardiac monitor, telemetry assessment procedure when appropriate:1}  Medications Ordered in  ED Medications - No data to display  ED Course/ Medical Decision Making/ A&P   {   Click here for ABCD2, HEART and other calculatorsREFRESH Note before signing :1}                              Medical Decision Making Amount and/or Complexity of Data Reviewed Radiology: ordered.   Patient with point tenderness of the right lower anterior rib.  Crepitance.  X-ray negative.  Patient breathing comfortably.  Lungs clear to auscultation.  Patient reassured, no soft tissue infection or irregularity.  Breast not involved, no concern for breast mass.  Treat as costochondritis/chest wall pain.  {Document critical care time when appropriate:1} {Document review of labs and clinical decision tools ie heart score, Chads2Vasc2 etc:1}  {Document your independent review of radiology images, and any outside records:1} {Document your discussion with family members, caretakers, and with consultants:1} {Document social determinants of health affecting pt's care:1} {Document your decision making why or why not admission, treatments were needed:1} Final Clinical Impression(s) / ED Diagnoses Final diagnoses:  Chest wall pain    Rx / DC Orders ED Discharge Orders          Ordered    ibuprofen (ADVIL) 600 MG tablet  Every 6 hours PRN        09/06/23 0037

## 2023-11-03 ENCOUNTER — Telehealth (HOSPITAL_COMMUNITY): Payer: Self-pay | Admitting: Professional

## 2023-11-03 NOTE — Telephone Encounter (Signed)
 Cln rtn call. Cln asked for for more info abt what services pt is looking for. Pt reports she is looking for inpt. She reports recent SI but denies intent/plan at this moment. She reports she did have a "breakdown" last night and was able to get a friend to help. She has been calling around to identify where she can go for help. She is with a friend that brought her to [place] to go to the ED. Cln discusses option of ED vs. [facility]. Antony Baumgartner reports she would like to go to [facility] and address ([address] was provided. Antony Baumgartner reports her friend will drop her off at [facility] shortly after they have lunch. Again, she denies SI/HI at this moment and reports being hopeful due to optimism about getting help.She reports she currently has Community education officer.

## 2023-11-30 DIAGNOSIS — R112 Nausea with vomiting, unspecified: Secondary | ICD-10-CM | POA: Diagnosis not present

## 2023-12-01 DIAGNOSIS — R630 Anorexia: Secondary | ICD-10-CM | POA: Diagnosis not present

## 2023-12-01 DIAGNOSIS — F330 Major depressive disorder, recurrent, mild: Secondary | ICD-10-CM | POA: Diagnosis not present

## 2023-12-01 DIAGNOSIS — G4739 Other sleep apnea: Secondary | ICD-10-CM | POA: Diagnosis not present

## 2023-12-01 DIAGNOSIS — F3341 Major depressive disorder, recurrent, in partial remission: Secondary | ICD-10-CM | POA: Diagnosis not present

## 2023-12-01 DIAGNOSIS — Z681 Body mass index (BMI) 19 or less, adult: Secondary | ICD-10-CM | POA: Diagnosis not present

## 2024-01-03 DIAGNOSIS — N945 Secondary dysmenorrhea: Secondary | ICD-10-CM | POA: Diagnosis not present

## 2024-01-03 DIAGNOSIS — F330 Major depressive disorder, recurrent, mild: Secondary | ICD-10-CM | POA: Diagnosis not present

## 2024-01-03 DIAGNOSIS — R630 Anorexia: Secondary | ICD-10-CM | POA: Diagnosis not present

## 2024-01-03 DIAGNOSIS — G4739 Other sleep apnea: Secondary | ICD-10-CM | POA: Diagnosis not present

## 2024-01-03 DIAGNOSIS — Z681 Body mass index (BMI) 19 or less, adult: Secondary | ICD-10-CM | POA: Diagnosis not present

## 2024-02-01 ENCOUNTER — Encounter: Payer: Self-pay | Admitting: Women's Health

## 2024-02-28 ENCOUNTER — Encounter: Payer: Self-pay | Admitting: Women's Health

## 2024-02-28 ENCOUNTER — Ambulatory Visit (INDEPENDENT_AMBULATORY_CARE_PROVIDER_SITE_OTHER): Admitting: Women's Health

## 2024-02-28 ENCOUNTER — Other Ambulatory Visit (HOSPITAL_COMMUNITY)
Admission: RE | Admit: 2024-02-28 | Discharge: 2024-02-28 | Disposition: A | Discharge status: Home / Self Care | Source: Ambulatory Visit | Attending: Women's Health | Admitting: Women's Health

## 2024-02-28 VITALS — BP 124/78 | HR 79 | Ht 63.0 in | Wt 98.0 lb

## 2024-02-28 DIAGNOSIS — Z113 Encounter for screening for infections with a predominantly sexual mode of transmission: Secondary | ICD-10-CM | POA: Diagnosis not present

## 2024-02-28 DIAGNOSIS — Z124 Encounter for screening for malignant neoplasm of cervix: Secondary | ICD-10-CM | POA: Diagnosis present

## 2024-02-28 DIAGNOSIS — Z1151 Encounter for screening for human papillomavirus (HPV): Secondary | ICD-10-CM | POA: Diagnosis not present

## 2024-02-28 DIAGNOSIS — Z01419 Encounter for gynecological examination (general) (routine) without abnormal findings: Secondary | ICD-10-CM

## 2024-02-28 DIAGNOSIS — N926 Irregular menstruation, unspecified: Secondary | ICD-10-CM | POA: Diagnosis not present

## 2024-02-28 NOTE — Progress Notes (Signed)
 WELL-WOMAN EXAMINATION Patient name: Elizabeth  BETTI Wilson MRN [id number]  Date of birth: 2000/08/12 Chief Complaint:   Annual Exam  History of Present Illness:   Elizabeth Wilson is a 23 y.o. G0P0000  female being seen today for a routine well-woman exam.  Current complaints: started depo few months ago w/ PCP, bleeding x 2wks. Denies abnormal discharge, itching/odor/irritation.    PCP: Hasanaj      Patient's last menstrual period was 02/01/2024 (exact date). The current method of family planning is Depo-Provera injections.  Last pap never. Results were: N/A. H/O abnormal pap: no Last mammogram: never. Results were: N/A. Family h/o breast cancer: no Last colonoscopy: never. Results were: N/A. Family h/o colorectal cancer: no     02/28/2024   11:35 AM 03/08/2017    1:49 PM  Depression screen PHQ 2/9  Decreased Interest 1 1  Down, Depressed, Hopeless 0 0  PHQ - 2 Score 1 1  Altered sleeping 1 3  Tired, decreased energy 1 3  Change in appetite 2 2  Feeling bad or failure about yourself  0 0  Trouble concentrating 2 3  Moving slowly or fidgety/restless 0 0  Suicidal thoughts 0 0  PHQ-9 Score 7 12  Difficult doing work/chores  Somewhat difficult        02/28/2024   11:35 AM  GAD 7 : Generalized Anxiety Score  Nervous, Anxious, on Edge 1  Control/stop worrying 2  Worry too much - different things 3  Trouble relaxing 2  Restless 3  Easily annoyed or irritable 3  Afraid - awful might happen 3  Total GAD 7 Score 17     Review of Systems:   Pertinent items are noted in HPI Denies any headaches, blurred vision, fatigue, shortness of breath, chest pain, abdominal pain, abnormal vaginal discharge/itching/odor/irritation, problems with periods, bowel movements, urination, or intercourse unless otherwise stated above. Pertinent History Reviewed:  Reviewed past medical,surgical, social and family history.  Reviewed problem list, medications and allergies. Physical Assessment:    Vitals:   02/28/24 1128  BP: 124/78  Pulse: 79  Weight: 98 lb (44.5 kg)  Height: 5' 3 (1.6 m)  Body mass index is 17.36 kg/m.        Physical Examination:   General appearance - well appearing, and in no distress  Mental status - alert, oriented to person, place, and time  Psych:  She has a normal mood and affect  Skin - warm and dry, normal color, no suspicious lesions noted  Chest - effort normal, all lung fields clear to auscultation bilaterally  Heart - normal rate and regular rhythm  Neck:  midline trachea, no thyromegaly or nodules  Breasts - breasts appear normal, no suspicious masses, no skin or nipple changes or  axillary nodes  Abdomen - soft, nontender, nondistended, no masses or organomegaly  Pelvic - VULVA: normal appearing vulva with no masses, tenderness or lesions  VAGINA: normal appearing vagina with normal color and discharge, no lesions  CERVIX: normal appearing cervix without discharge or lesions, no CMT  Thin prep pap is done w/ reflex HR HPV cotesting  UTERUS: uterus is felt to be normal size, shape, consistency and nontender   ADNEXA: No adnexal masses or tenderness noted.  Extremities:  No swelling or varicosities noted  Chaperone: Elizabeth Wilson  No results found for this or any previous visit (from the past 24 hours).  Assessment & Plan:  1) Well-Woman Exam  2) Irregular bleeding w/ depo> discussed typical patterns, let  us  know if not improving in next few months. CV swab today  3) STD screen  Labs/procedures today: pap, CV swab  Mammogram: @ 25yo, or sooner if problems Colonoscopy: @ 25yo, or sooner if problems  No orders of the defined types were placed in this encounter.   Meds: No orders of the defined types were placed in this encounter.   Follow-up: Return in about 1 year (around 02/27/2025) for Physical.  Elizabeth Wilson CNM, WHNP-BC 02/28/2024 11:46 AM

## 2024-02-29 ENCOUNTER — Ambulatory Visit: Payer: Self-pay | Admitting: Women's Health

## 2024-02-29 LAB — CERVICOVAGINAL ANCILLARY ONLY
Bacterial Vaginitis (gardnerella): NEGATIVE
Candida Glabrata: NEGATIVE
Candida Vaginitis: POSITIVE — AB
Chlamydia: NEGATIVE
Comment: NEGATIVE
Comment: NEGATIVE
Comment: NEGATIVE
Comment: NEGATIVE
Comment: NEGATIVE
Comment: NORMAL
Neisseria Gonorrhea: NEGATIVE
Trichomonas: NEGATIVE

## 2024-02-29 MED ORDER — FLUCONAZOLE 150 MG PO TABS: 150.0000 mg | ORAL_TABLET | Freq: Once | ORAL | 0 refills | Status: AC

## 2024-03-01 LAB — CYTOLOGY - PAP
Diagnosis: NEGATIVE
Diagnosis: REACTIVE

## 2024-03-06 DIAGNOSIS — Z681 Body mass index (BMI) 19 or less, adult: Secondary | ICD-10-CM | POA: Diagnosis not present

## 2024-03-06 DIAGNOSIS — Z0000 Encounter for general adult medical examination without abnormal findings: Secondary | ICD-10-CM | POA: Diagnosis not present

## 2024-03-24 DIAGNOSIS — N939 Abnormal uterine and vaginal bleeding, unspecified: Secondary | ICD-10-CM | POA: Diagnosis not present

## 2024-03-24 DIAGNOSIS — R112 Nausea with vomiting, unspecified: Secondary | ICD-10-CM | POA: Diagnosis not present

## 2024-03-24 DIAGNOSIS — R079 Chest pain, unspecified: Secondary | ICD-10-CM | POA: Diagnosis not present

## 2024-03-24 DIAGNOSIS — R197 Diarrhea, unspecified: Secondary | ICD-10-CM | POA: Diagnosis not present

## 2024-03-29 DIAGNOSIS — W228XXA Striking against or struck by other objects, initial encounter: Secondary | ICD-10-CM | POA: Diagnosis not present

## 2024-03-29 DIAGNOSIS — M25531 Pain in right wrist: Secondary | ICD-10-CM | POA: Diagnosis not present

## 2024-03-29 DIAGNOSIS — Z59868 Other specified financial insecurity: Secondary | ICD-10-CM | POA: Diagnosis not present

## 2024-03-29 DIAGNOSIS — S0991XA Unspecified injury of ear, initial encounter: Secondary | ICD-10-CM | POA: Diagnosis not present

## 2024-03-29 DIAGNOSIS — X58XXXA Exposure to other specified factors, initial encounter: Secondary | ICD-10-CM | POA: Diagnosis not present

## 2024-05-14 DIAGNOSIS — N6341 Unspecified lump in right breast, subareolar: Secondary | ICD-10-CM | POA: Diagnosis not present

## 2024-05-14 DIAGNOSIS — Z681 Body mass index (BMI) 19 or less, adult: Secondary | ICD-10-CM | POA: Diagnosis not present

## 2024-05-15 ENCOUNTER — Other Ambulatory Visit (HOSPITAL_COMMUNITY): Payer: Self-pay | Admitting: Internal Medicine

## 2024-05-15 ENCOUNTER — Encounter (HOSPITAL_COMMUNITY): Payer: Self-pay | Admitting: Internal Medicine

## 2024-05-15 DIAGNOSIS — N63 Unspecified lump in unspecified breast: Secondary | ICD-10-CM

## 2024-05-29 ENCOUNTER — Ambulatory Visit (HOSPITAL_COMMUNITY): Admission: RE | Admit: 2024-05-29 | Discharge: 2024-05-29 | Attending: Internal Medicine | Admitting: Internal Medicine

## 2024-05-29 ENCOUNTER — Other Ambulatory Visit (HOSPITAL_COMMUNITY): Payer: Self-pay | Admitting: Internal Medicine

## 2024-05-29 DIAGNOSIS — N6315 Unspecified lump in the right breast, overlapping quadrants: Secondary | ICD-10-CM | POA: Diagnosis not present

## 2024-05-29 DIAGNOSIS — N63 Unspecified lump in unspecified breast: Secondary | ICD-10-CM | POA: Insufficient documentation

## 2024-05-29 DIAGNOSIS — N6322 Unspecified lump in the left breast, upper inner quadrant: Secondary | ICD-10-CM | POA: Diagnosis not present

## 2024-05-29 DIAGNOSIS — Z803 Family history of malignant neoplasm of breast: Secondary | ICD-10-CM | POA: Diagnosis not present

## 2024-05-29 DIAGNOSIS — N6452 Nipple discharge: Secondary | ICD-10-CM | POA: Diagnosis not present

## 2024-06-02 DIAGNOSIS — R509 Fever, unspecified: Secondary | ICD-10-CM | POA: Diagnosis not present

## 2024-06-02 DIAGNOSIS — F418 Other specified anxiety disorders: Secondary | ICD-10-CM | POA: Diagnosis not present

## 2024-06-02 DIAGNOSIS — R0602 Shortness of breath: Secondary | ICD-10-CM | POA: Diagnosis not present

## 2024-06-02 DIAGNOSIS — R112 Nausea with vomiting, unspecified: Secondary | ICD-10-CM | POA: Diagnosis not present

## 2024-06-02 DIAGNOSIS — R059 Cough, unspecified: Secondary | ICD-10-CM | POA: Diagnosis not present

## 2024-06-02 DIAGNOSIS — J069 Acute upper respiratory infection, unspecified: Secondary | ICD-10-CM | POA: Diagnosis not present

## 2024-06-02 DIAGNOSIS — B974 Respiratory syncytial virus as the cause of diseases classified elsewhere: Secondary | ICD-10-CM | POA: Diagnosis not present

## 2024-06-18 ENCOUNTER — Other Ambulatory Visit (HOSPITAL_COMMUNITY): Payer: Self-pay | Admitting: Internal Medicine

## 2024-06-18 DIAGNOSIS — N63 Unspecified lump in unspecified breast: Secondary | ICD-10-CM

## 2024-06-19 ENCOUNTER — Encounter (HOSPITAL_COMMUNITY): Payer: Self-pay

## 2024-06-19 ENCOUNTER — Encounter (HOSPITAL_COMMUNITY)

## 2024-06-19 ENCOUNTER — Ambulatory Visit (HOSPITAL_COMMUNITY)
Admission: RE | Admit: 2024-06-19 | Discharge: 2024-06-19 | Disposition: A | Discharge status: Home / Self Care | Source: Ambulatory Visit | Attending: Internal Medicine | Admitting: Internal Medicine

## 2024-06-19 DIAGNOSIS — N63 Unspecified lump in unspecified breast: Secondary | ICD-10-CM | POA: Diagnosis present

## 2024-06-19 HISTORY — PX: BREAST BIOPSY: SHX20

## 2024-06-19 MED ORDER — LIDOCAINE HCL (PF) 2 % IJ SOLN
10.0000 mL | Freq: Once | INTRAMUSCULAR | Status: AC
  Administered 2024-06-19: 10 mL

## 2024-06-19 MED ORDER — LIDOCAINE HCL (PF) 2 % IJ SOLN
INTRAMUSCULAR | Status: AC
  Filled 2024-06-19: qty 10

## 2024-06-19 MED ORDER — LIDOCAINE-EPINEPHRINE (PF) 1 %-1:200000 IJ SOLN
10.0000 mL | Freq: Once | INTRAMUSCULAR | Status: AC
  Administered 2024-06-19: 10 mL via INTRADERMAL

## 2024-06-19 MED ORDER — LIDOCAINE-EPINEPHRINE (PF) 1 %-1:200000 IJ SOLN
INTRAMUSCULAR | Status: AC
  Filled 2024-06-19: qty 30

## 2024-06-19 NOTE — Progress Notes (Signed)
 PT tolerated right breast biopsy well today with NAD noted. PT verbalized understanding of discharge instructions and ambulatory at departure. Specimens taken to lab for processing by Lexi from ultrasound.

## 2024-06-20 LAB — SURGICAL PATHOLOGY
